# Patient Record
Sex: Male | Born: 1975 | Race: Black or African American | Hispanic: No | Marital: Married | Smoking: Never smoker
Health system: Southern US, Community
[De-identification: ages and names within clinical notes are randomized; demographics above are authoritative.]

## PROBLEM LIST (undated history)

## (undated) DIAGNOSIS — I10 Essential (primary) hypertension: Secondary | ICD-10-CM

## (undated) HISTORY — PX: OTHER SURGICAL HISTORY: SHX169

## (undated) HISTORY — PX: MANDIBLE FRACTURE SURGERY: SHX706

---

## 2004-10-25 ENCOUNTER — Emergency Department (HOSPITAL_COMMUNITY): Admission: EM | Admit: 2004-10-25 | Discharge: 2004-10-25 | Payer: Self-pay | Admitting: Emergency Medicine

## 2005-07-23 ENCOUNTER — Emergency Department (HOSPITAL_COMMUNITY): Admission: EM | Admit: 2005-07-23 | Discharge: 2005-07-23 | Payer: Self-pay | Admitting: Emergency Medicine

## 2005-07-24 ENCOUNTER — Emergency Department (HOSPITAL_COMMUNITY): Admission: EM | Admit: 2005-07-24 | Discharge: 2005-07-24 | Payer: Self-pay | Admitting: Emergency Medicine

## 2005-10-20 ENCOUNTER — Emergency Department (HOSPITAL_COMMUNITY): Admission: EM | Admit: 2005-10-20 | Discharge: 2005-10-20 | Payer: Self-pay | Admitting: Emergency Medicine

## 2007-03-19 ENCOUNTER — Emergency Department (HOSPITAL_COMMUNITY): Admission: EM | Admit: 2007-03-19 | Discharge: 2007-03-19 | Payer: Self-pay | Admitting: Emergency Medicine

## 2007-06-18 ENCOUNTER — Emergency Department (HOSPITAL_COMMUNITY): Admission: EM | Admit: 2007-06-18 | Discharge: 2007-06-18 | Payer: Self-pay | Admitting: Emergency Medicine

## 2008-05-02 ENCOUNTER — Emergency Department (HOSPITAL_COMMUNITY): Admission: EM | Admit: 2008-05-02 | Discharge: 2008-05-02 | Payer: Self-pay | Admitting: Emergency Medicine

## 2008-05-03 ENCOUNTER — Ambulatory Visit (HOSPITAL_COMMUNITY): Admission: RE | Admit: 2008-05-03 | Discharge: 2008-05-03 | Payer: Self-pay | Admitting: Orthopedic Surgery

## 2010-07-23 LAB — CBC
Hemoglobin: 14.8 g/dL (ref 13.0–17.0)
Platelets: 253 10*3/uL (ref 150–400)
RBC: 5.03 MIL/uL (ref 4.22–5.81)

## 2010-08-21 NOTE — Op Note (Signed)
NAMEAADIL, SUR             ACCOUNT NO.:  [id number]   MEDICAL RECORD NO.:  [id number]          PATIENT TYPE:  AMB   LOCATION:  SDS                          FACILITY:  [facility]   PHYSICIAN:  Burnard Bunting, M.D.    DATE OF BIRTH:  06-Nov-1975   DATE OF PROCEDURE:  05/03/2008  DATE OF DISCHARGE:  05/03/2008                               OPERATIVE REPORT   PREOPERATIVE DIAGNOSIS:  Right Achilles tendon rupture repair.   POSTOPERATIVE DIAGNOSIS:  Right Achilles tendon rupture repair.   PROCEDURE:  Right Achilles tendon rupture repair.   SURGEON:  Burnard Bunting, MD   ASSISTANT:  None.   ANESTHESIA:  General endotracheal.   ESTIMATED BLOOD LOSS:  Minimal.   INDICATIONS:  Richard Valentine is an active 35 year old patient with  right Achilles tendon rupture who presents now for repair after  explanation of risks and benefits.   PROCEDURE IN DETAIL:  The patient was brought to the operating room  where general endotracheal anesthesia was induced.  Preoperative  antibiotics were  administered.  Time-out was called.  The patient was  placed in a prone position.  Both lower extremities were then prepped  with DuraPrep solution and draped in a sterile manner.  Right lower  extremity over the area of the incision was pre-scrubbed with a  chlorhexidine, alcohol and Betadine which allowed the air dry and then  prepped with DuraPrep.  Collier Flowers was used to cover the operative field.  Ankle Esmarch was utilized for approximately 50 minutes.  Midline  incision was made.  Skin and subcutaneous tissue were sharply divided.  Paratenon was divided as well.  Achilles tendon was identified.  The  patient had a midsubstance rupture with about 2 cm gap.  Two #2  FiberWire sutures were placed in grasping [place] fashion through each to  the tendon ends.  Both legs were then flexed and the four strands were  tied on the right leg to the resting tension of the Achilles tendon on  the left leg.  The tendon  was then oversewn with 3-0 Vicryl suture at  the edges.  Incision was thoroughly irrigated.  Paratenon was then  closed over the tendon using 3-0 Vicryl suture.  Tourniquet was  released.  Bleeding points encountered and controlled with  electrocautery.  The skin was closed using interrupted inverted 3-0  Vicryl suture, followed by 3-0 nylon simple sutures.  Bulky, well-padded  posterior splint was applied.  The patient tolerated the procedure well  without any immediate complications.      Burnard Bunting, M.D.  Electronically Signed    GSD/MEDQ  D:  05/03/2008  T:  05/04/2008  Job:  [id number]

## 2010-09-13 ENCOUNTER — Emergency Department (HOSPITAL_COMMUNITY)
Admission: EM | Admit: 2010-09-13 | Discharge: 2010-09-13 | Disposition: A | Discharge status: Home / Self Care | Payer: Self-pay | Attending: Emergency Medicine | Admitting: Emergency Medicine

## 2010-09-13 DIAGNOSIS — IMO0001 Reserved for inherently not codable concepts without codable children: Secondary | ICD-10-CM | POA: Insufficient documentation

## 2010-09-13 DIAGNOSIS — B9789 Other viral agents as the cause of diseases classified elsewhere: Secondary | ICD-10-CM | POA: Insufficient documentation

## 2010-09-13 DIAGNOSIS — R5381 Other malaise: Secondary | ICD-10-CM | POA: Insufficient documentation

## 2010-09-13 DIAGNOSIS — R070 Pain in throat: Secondary | ICD-10-CM | POA: Insufficient documentation

## 2010-09-13 DIAGNOSIS — R509 Fever, unspecified: Secondary | ICD-10-CM | POA: Insufficient documentation

## 2010-09-13 DIAGNOSIS — J3489 Other specified disorders of nose and nasal sinuses: Secondary | ICD-10-CM | POA: Insufficient documentation

## 2010-09-13 LAB — URINALYSIS, ROUTINE W REFLEX MICROSCOPIC
Glucose, UA: NEGATIVE mg/dL
Ketones, ur: NEGATIVE mg/dL
Nitrite: NEGATIVE
Specific Gravity, Urine: 1.007 (ref 1.005–1.030)

## 2010-09-13 LAB — URINE MICROSCOPIC-ADD ON

## 2010-12-31 LAB — POCT URINALYSIS DIP (DEVICE)
Glucose, UA: NEGATIVE
Ketones, ur: NEGATIVE
Operator id: 247071
Urobilinogen, UA: 1
pH: 6.5

## 2011-05-29 ENCOUNTER — Encounter (HOSPITAL_COMMUNITY): Payer: Self-pay | Admitting: Emergency Medicine

## 2011-05-29 ENCOUNTER — Other Ambulatory Visit: Payer: Self-pay

## 2011-05-29 ENCOUNTER — Emergency Department (HOSPITAL_COMMUNITY): Payer: Self-pay

## 2011-05-29 ENCOUNTER — Emergency Department (HOSPITAL_COMMUNITY)
Admission: EM | Admit: 2011-05-29 | Discharge: 2011-05-29 | Disposition: A | Discharge status: Home Health | Payer: Self-pay | Attending: Emergency Medicine | Admitting: Emergency Medicine

## 2011-05-29 DIAGNOSIS — I10 Essential (primary) hypertension: Secondary | ICD-10-CM | POA: Insufficient documentation

## 2011-05-29 DIAGNOSIS — R0789 Other chest pain: Secondary | ICD-10-CM

## 2011-05-29 DIAGNOSIS — R1013 Epigastric pain: Secondary | ICD-10-CM | POA: Insufficient documentation

## 2011-05-29 DIAGNOSIS — R079 Chest pain, unspecified: Secondary | ICD-10-CM | POA: Insufficient documentation

## 2011-05-29 LAB — CBC
HCT: 41.4 % (ref 39.0–52.0)
MCH: 29.2 pg (ref 26.0–34.0)
Platelets: 224 10*3/uL (ref 150–400)

## 2011-05-29 LAB — TROPONIN I: Troponin I: <0.3 ng/mL (ref <0.30)

## 2011-05-29 LAB — DIFFERENTIAL
Basophils Absolute: 0 10*3/uL (ref 0.0–0.1)
Basophils Relative: 0 % (ref 0–1)
Eosinophils Absolute: 0.3 10*3/uL (ref 0.0–0.7)
Lymphocytes Relative: 44 % (ref 12–46)
Monocytes Relative: 8 % (ref 3–12)
Neutro Abs: 2.4 10*3/uL (ref 1.7–7.7)

## 2011-05-29 LAB — COMPREHENSIVE METABOLIC PANEL
AST: 20 U/L (ref 0–37)
Chloride: 103 mEq/L (ref 96–112)
Potassium: 4.1 mEq/L (ref 3.5–5.1)

## 2011-05-29 LAB — D-DIMER, QUANTITATIVE: D-Dimer, Quant: <0.22 ug/mL-FEU (ref 0.00–0.48)

## 2011-05-29 LAB — LIPASE, BLOOD: Lipase: 25 U/L (ref 11–59)

## 2011-05-29 MED ORDER — GI COCKTAIL ~~LOC~~
30.0000 mL | Freq: Once | ORAL | Status: AC
  Administered 2011-05-29: 30 mL via ORAL
  Filled 2011-05-29: qty 30

## 2011-05-29 MED ORDER — FAMOTIDINE 20 MG PO TABS
40.0000 mg | ORAL_TABLET | Freq: Once | ORAL | Status: AC
  Administered 2011-05-29: 40 mg via ORAL
  Filled 2011-05-29: qty 2

## 2011-05-29 NOTE — Discharge Instructions (Signed)
RESOURCE GUIDE  Dental Problems  Patients with Medicaid: [facility]                     [facility] [address]                                           [address] Phone:  [phone]                                                  Phone:  [phone]  If unable to pay or uninsured, contact:  [facility] or [facility] to become qualified for the adult dental clinic.  Chronic Pain Problems Contact [facility]  [phone] Patients need to be referred by their primary care doctor.  Insufficient Money for Medicine Contact [employer]:  call "211" or [facility] 271-5999.  No Primary Care Doctor Call [facility]  832-8000 Other agencies that provide inexpensive medical care    [facility]  [phone]    [facility]  [phone]    [facility]  [phone]    Women[facility] Clinic  [phone]    [facility]  [phone]    [facility]  [phone]  Psychological Services [facility]  [phone] [facility]  [phone] [facility]   [phone] (emergency services [phone])  Substance Abuse Resources [facility]  336-882-2125 [facility] 336-784-9470 [facility] 336-285-9073 [facility] 336-845-3988 Residential & Outpatient Substance Abuse Program  800-659-3381  Abuse/Neglect [place] Child Abuse Hotline (336) 641-3795 [place] Child Abuse Hotline 800-378-5315 (After Hours)  Emergency Shelter [facility] (336) 271-5985  Maternity [facility] (336) 275-9566 [facility] (704) 372-4663  MRSA Hotline #:   832-7006    [place] [facility]  [facility] [place]     [employer]                          [facility] [address] [place]                       [address]      [address]  [place]                                                [place]                            [place] Phone:  [phone]                                   Phone:  [phone]                 Phone:  [phone]  [facility] Phone:  [phone]    [facility] Child Abuse Hotline 5394910695 4321898286 (After Hours)    Eat a bland diet, avoiding greasy, fatty, fried foods, as well as spicy and acidic foods or beverages.  Avoid eating within the hour or 2 before going to bed or laying down.  Also avoid teas, colas, coffee, chocolate, pepermint and spearment.  Take over the counter pepcid, one tablet by mouth twice a day, for the next 2 to 3 weeks.  May also take over the counter maalox/mylanta, as directed on packaging, as needed for discomfort.  Keep a daily diary of your blood pressures, as discussed, to show your regular medical doctor during your follow up visit to determine if you need to be started on blood pressure medication.  Call your regular medical doctor today to schedule a follow up appointment this week.  Return to the Emergency Department immediately if worsening.

## 2011-05-29 NOTE — ED Notes (Signed)
Noted onset of fatigue and shortness of breath one week ago, came today d/t felt he should get it checked out.  Slight mid-sternal chest pain demonstrated w/ shortness of breath, diaphoresis, bilateral arm tingling

## 2011-05-29 NOTE — ED Provider Notes (Signed)
History     CSN: [id number]  Arrival date & time 05/29/11  4098   First MD Initiated Contact with Patient 05/29/11 0915      Chief Complaint  Patient presents with  . Chest Pain     HPI Pt was seen at 0930.  Per pt, c/o gradual onset and persistence of constant lower mid-sternal/upper abd "pain" x1 week.  Pt states he has had intermittent symptoms "for quite a while now," worsened over the past week.  Symptoms worsen after eating, are assoc with SOB.  Describes the SOB as "the pain takes my breath away."  Describes the pain as "like a bubble."  Denies palpitations, no cough, no N/V/D, no back pain, no fevers, no rash, no calf/LE pain or unilateral swelling.      PMD:  Dr. Ronne Binning No past medical history on file.  Past Surgical History  Procedure Date  . Patella tenden rupture repair   . Achilles tenden rupture repair     History  Substance Use Topics  . Smoking status: Never Smoker   . Smokeless tobacco: Never Used  . Alcohol Use: 5.2 oz/week    7 Shots of liquor, 2 Drinks containing 0.5 oz of alcohol per week    Review of Systems ROS: Statement: All systems negative except as marked or noted in the HPI; Constitutional: Negative for fever and chills. ; ; Eyes: Negative for eye pain, redness and discharge. ; ; ENMT: Negative for ear pain, hoarseness, nasal congestion, sinus pressure and sore throat. ; ; Cardiovascular: +CP, SOB. Negative for palpitations and peripheral edema. ; ; Respiratory: Negative for cough, wheezing and stridor. ; ; Gastrointestinal: +upper abd pain. Negative for nausea, vomiting, diarrhea, blood in stool, hematemesis, jaundice and rectal bleeding. . ; ; Genitourinary: Negative for dysuria, flank pain and hematuria. ; ; Musculoskeletal: Negative for back pain and neck pain. Negative for swelling and trauma.; ; Skin: Negative for pruritus, rash, abrasions, blisters, bruising and skin lesion.; ; Neuro: Negative for headache, lightheadedness and neck stiffness.  Negative for weakness, altered level of consciousness , altered mental status, extremity weakness, paresthesias, involuntary movement, seizure and syncope.     Allergies  Percocet  Home Medications  No current outpatient prescriptions on file.  BP 182/105  Pulse 73  Temp(Src) 98.6 F (37 C) (Oral)  Resp 16  Ht 5\' 11"  (1.803 m)  Wt 210 lb (95.255 kg)  BMI 29.29 kg/m2  SpO2 100%  Physical Exam 0935: Physical examination:  Nursing notes reviewed; Vital signs and O2 SAT reviewed;  Constitutional: Well developed, Well nourished, Well hydrated, In no acute distress; Head:  Normocephalic, atraumatic; Eyes: EOMI, PERRL, No scleral icterus; ENMT: Mouth and pharynx normal, Mucous membranes moist; Neck: Supple, Full range of motion, No lymphadenopathy; Cardiovascular: Regular rate and rhythm, No murmur, rub, or gallop; Respiratory: Breath sounds clear & equal bilaterally, No rales, rhonchi, wheezes, or rub, Normal respiratory effort/excursion; Chest: Nontender, Movement normal; Abdomen: Soft, +mild mid-epigastric tenderness to palp, no rebound or guarding, Nondistended, Normal bowel sounds; Extremities: Pulses normal, No tenderness, No edema, No calf edema or asymmetry.; Neuro: AA&Ox3, Major CN grossly intact. Speech clear, no facial droop, normal coordination.  No gross focal motor or sensory deficits in extremities.; Skin: Color normal, Warm, Dry, no rash.    ED Course  Procedures   MDM  MDM Reviewed: nursing note and vitals Interpretation: ECG, labs and x-ray    Date: 05/29/2011  Rate: 88  Rhythm: normal sinus rhythm  QRS Axis: normal  Intervals: normal  ST/T Wave abnormalities: normal  Conduction Disutrbances:none  Narrative Interpretation:   Old EKG Reviewed: none available.   Results for orders placed during the hospital encounter of 05/29/11  CBC      Component Value Range   WBC 5.6  4.0 - 10.5 (K/uL)   RBC 5.03  4.22 - 5.81 (MIL/uL)   Hemoglobin 14.7  13.0 - 17.0 (g/dL)     HCT 16.1  09.6 - 04.5 (%)   MCV 82.3  78.0 - 100.0 (fL)   MCH 29.2  26.0 - 34.0 (pg)   MCHC 35.5  30.0 - 36.0 (g/dL)   RDW 40.9  81.1 - 91.4 (%)   Platelets 224  150 - 400 (K/uL)  DIFFERENTIAL      Component Value Range   Neutrophils Relative 43  43 - 77 (%)   Neutro Abs 2.4  1.7 - 7.7 (K/uL)   Lymphocytes Relative 44  12 - 46 (%)   Lymphs Abs 2.5  0.7 - 4.0 (K/uL)   Monocytes Relative 8  3 - 12 (%)   Monocytes Absolute 0.4  0.1 - 1.0 (K/uL)   Eosinophils Relative 5  0 - 5 (%)   Eosinophils Absolute 0.3  0.0 - 0.7 (K/uL)   Basophils Relative 0  0 - 1 (%)   Basophils Absolute 0.0  0.0 - 0.1 (K/uL)  COMPREHENSIVE METABOLIC PANEL      Component Value Range   Sodium 138  135 - 145 (mEq/L)   Potassium 4.1  3.5 - 5.1 (mEq/L)   Chloride 103  96 - 112 (mEq/L)   CO2 27  19 - 32 (mEq/L)   Glucose, Bld 99  70 - 99 (mg/dL)   BUN 13  6 - 23 (mg/dL)   Creatinine, Ser 7.82  0.50 - 1.35 (mg/dL)   Calcium 9.6  8.4 - 95.6 (mg/dL)   Total Protein 7.7  6.0 - 8.3 (g/dL)   Albumin 4.0  3.5 - 5.2 (g/dL)   AST 20  0 - 37 (U/L)   ALT 21  0 - 53 (U/L)   Alkaline Phosphatase 46  39 - 117 (U/L)   Total Bilirubin 0.3  0.3 - 1.2 (mg/dL)   GFR calc non Af Amer 70 (*) >90 (mL/min)   GFR calc Af Amer 82 (*) >90 (mL/min)  LIPASE, BLOOD      Component Value Range   Lipase 25  11 - 59 (U/L)  TROPONIN I      Component Value Range   Troponin I <0.30  <0.30 (ng/mL)  D-DIMER, QUANTITATIVE      Component Value Range   D-Dimer, Quant <0.22  0.00 - 0.48 (ug/mL-FEU)   Dg Chest 2 View 05/29/2011  *RADIOLOGY REPORT*  Clinical Data: 36 year old male with chest pain, shortness of breath.  CHEST - 2 VIEW  Comparison: None.  Findings: Lung volumes are within normal limits. Normal cardiac size and mediastinal contours.  Visualized tracheal air column is within normal limits.  The lungs are clear.  No pneumothorax or effusion. No osseous abnormality identified.  IMPRESSION: Negative, no acute cardiopulmonary  abnormality.  Original Report Authenticated By: Harley Hallmark, M.D.     11:52 AM:  Feels improved after meds and wants to go home now.  BP now 150/99, HR 80's, Sats 100% R/A on my re-exam.  Doubt PE with neg d-dimer, no hypoxia/tachycardia/tachypnea today.  Doubt ACS as cause for symptoms given constant symptoms x1 week with neg troponin and normal EKG.  Long  d/w pt regarding HTN, including keeping BP diary to show his PMD at f/u visit.  States he didn't go see his PMD for these ongoing symptoms ("for a while now") because his doctor "only has certain hours he's open to see people."  Is agreeable to make an appt with his PMD for f/u.  Dx testing d/w pt.  Questions answered.  Verb understanding, agreeable to d/c home with outpt f/u.            Laray Anger, DO 05/30/11 [phone]

## 2014-03-08 ENCOUNTER — Encounter (HOSPITAL_COMMUNITY): Payer: Self-pay | Admitting: *Deleted

## 2014-03-08 ENCOUNTER — Emergency Department (HOSPITAL_COMMUNITY)
Admission: EM | Admit: 2014-03-08 | Discharge: 2014-03-08 | Disposition: A | Discharge status: Nursing Home (Includes ICF) | Payer: No Typology Code available for payment source | Source: Home / Self Care | Attending: Emergency Medicine | Admitting: Emergency Medicine

## 2014-03-08 ENCOUNTER — Emergency Department (HOSPITAL_COMMUNITY): Payer: No Typology Code available for payment source

## 2014-03-08 ENCOUNTER — Encounter (HOSPITAL_COMMUNITY): Payer: Self-pay

## 2014-03-08 ENCOUNTER — Emergency Department (HOSPITAL_COMMUNITY)
Admission: EM | Admit: 2014-03-08 | Discharge: 2014-03-09 | Disposition: A | Discharge status: Home / Self Care | Payer: No Typology Code available for payment source | Attending: Emergency Medicine | Admitting: Emergency Medicine

## 2014-03-08 DIAGNOSIS — R5383 Other fatigue: Secondary | ICD-10-CM | POA: Diagnosis not present

## 2014-03-08 DIAGNOSIS — I209 Angina pectoris, unspecified: Secondary | ICD-10-CM

## 2014-03-08 DIAGNOSIS — R0789 Other chest pain: Secondary | ICD-10-CM | POA: Insufficient documentation

## 2014-03-08 DIAGNOSIS — I10 Essential (primary) hypertension: Secondary | ICD-10-CM | POA: Diagnosis not present

## 2014-03-08 DIAGNOSIS — R079 Chest pain, unspecified: Secondary | ICD-10-CM

## 2014-03-08 LAB — I-STAT TROPONIN, ED: Troponin i, poc: 0 ng/mL (ref 0.00–0.08)

## 2014-03-08 LAB — BASIC METABOLIC PANEL
Anion gap: 13 (ref 5–15)
BUN: 14 mg/dL (ref 6–23)
CHLORIDE: 99 meq/L (ref 96–112)
CO2: 25 meq/L (ref 19–32)
CREATININE: 1.35 mg/dL (ref 0.50–1.35)
Calcium: 9 mg/dL (ref 8.4–10.5)
GFR calc Af Amer: 77 mL/min — ABNORMAL LOW (ref >90)
GFR calc non Af Amer: 66 mL/min — ABNORMAL LOW (ref >90)
GLUCOSE: 102 mg/dL — AB (ref 70–99)
POTASSIUM: 3.7 meq/L (ref 3.7–5.3)
Sodium: 137 mEq/L (ref 137–147)

## 2014-03-08 LAB — CBC
HEMATOCRIT: 40.3 % (ref 39.0–52.0)
HEMOGLOBIN: 14.2 g/dL (ref 13.0–17.0)
MCH: 29.9 pg (ref 26.0–34.0)
MCHC: 35.2 g/dL (ref 30.0–36.0)
MCV: 84.8 fL (ref 78.0–100.0)
Platelets: 242 10*3/uL (ref 150–400)
RBC: 4.75 MIL/uL (ref 4.22–5.81)
RDW: 12.3 % (ref 11.5–15.5)
WBC: 7.7 10*3/uL (ref 4.0–10.5)

## 2014-03-08 MED ORDER — ASPIRIN 81 MG PO CHEW
CHEWABLE_TABLET | ORAL | Status: AC
  Filled 2014-03-08: qty 4

## 2014-03-08 MED ORDER — HYDRALAZINE HCL 10 MG PO TABS
10.0000 mg | ORAL_TABLET | Freq: Once | ORAL | Status: AC
  Administered 2014-03-08: 10 mg via ORAL
  Filled 2014-03-08: qty 1

## 2014-03-08 MED ORDER — ASPIRIN 81 MG PO CHEW
324.0000 mg | CHEWABLE_TABLET | Freq: Once | ORAL | Status: AC
  Administered 2014-03-08: 324 mg via ORAL

## 2014-03-08 MED ORDER — ASPIRIN 81 MG PO CHEW: 324.0000 mg | CHEWABLE_TABLET | Freq: Once | ORAL | Status: DC

## 2014-03-08 MED ORDER — NITROGLYCERIN 0.4 MG SL SUBL
0.4000 mg | SUBLINGUAL_TABLET | SUBLINGUAL | Status: AC | PRN
  Administered 2014-03-08: 0.4 mg via SUBLINGUAL

## 2014-03-08 MED ORDER — NITROGLYCERIN 0.4 MG SL SUBL
SUBLINGUAL_TABLET | SUBLINGUAL | Status: AC
  Filled 2014-03-08: qty 1

## 2014-03-08 NOTE — ED Notes (Signed)
[facility] 1 week mid chest dull 3/10; Pt denies hx smoking and htn; pt presents with htn; pt states some Sob throughout week when climbing stairs but denies Sob currently; pt a&o x 4; pt ambulates independently.

## 2014-03-08 NOTE — ED Notes (Signed)
C/o mid to R chest pain onset 1 week.  Pain comes and goes.  Pain lasts for 1 hr or 2 when he has it.  Has had it twice today.  No pain now.  C/o SOB on exertion of going up stairs and occasionally when he has the pain.  No nausea or sweating.  Pain radiates to R post. shoulder sometimes.  Has a little dull pain when he takes a deep breath. He lifts weights- last time was yesterday but did not have pain then.  States the pain is inside and knows the difference between muscle pain and something else.  States BP 3 days 160/96 and that is high for him.  Denies drug use.

## 2014-03-08 NOTE — Discharge Instructions (Signed)
We have determined that your problem requires further evaluation in the emergency department.  We will take care of your transport there.  Once at the emergency department, you will be evaluated by a provider and they will order whatever treatment or tests they deem necessary.  We cannot guarantee that they will do any specific test or do any specific treatment.  ° °

## 2014-03-08 NOTE — ED Provider Notes (Signed)
Chief Complaint   Chest Pain   History of Present Illness    Richard Valentine is a 38 year old male with a positive family history of coronary artery disease whose had a one half week history of recurring episodes of right parasternal chest pain radiating through to his back. The pain is rated 5/10 in intensity. It can last for minutes to hours at a time. It's not brought on by exertion. The pain is nonpleuritic. It is associated with shortness of breath, fatigue, palpitations, and dizziness. He denies any nausea or diaphoresis. He has no known heart disease himself, but his father is 6572 and has coronary artery disease and stents. He gets does not have any history of high blood pressure, but his blood pressure was 194/114 today. He has no diabetes or hypercholesterolemia history. He is not a cigarette smoker.  Review of Systems    Other than noted above, the patient denies any of the following symptoms. Systemic:  No fever or chills. Pulmonary:  No cough, wheezing, shortness of breath, sputum production, hemoptysis. Cardiac:  No palpitations, rapid heartbeat, dizziness, presyncope or syncope. GI:  No abdominal pain, heartburn, nausea, or vomiting. Ext:  No leg pain or swelling.  PMFSH    Past medical history, family history, social history, meds, and allergies were reviewed. He's allergic to Percocet.  Physical Exam     Vital signs:  BP 194/114 mmHg  Pulse 82  Temp(Src) 99 F (37.2 C) (Oral)  Resp 16  SpO2 100% Gen:  Alert, oriented, in no distress, skin warm and dry. ENT:  Mucous membranes moist, pharynx clear. Neck:  Supple, no adenopathy or tenderness.  No JVD. Lungs:  Clear to auscultation, no wheezes, rales or rhonchi.  No respiratory distress. Heart:  Regular rhythm.  No gallops, murmers, clicks or rubs. Chest:  No chest wall tenderness. Abdomen:  Soft, nontender, no organomegaly or mass.  Bowel sounds normal.  No pulsatile abdominal mass or bruit. Ext:  No edema.  No  calf tenderness and Homann's sign negative.  Pulses full and equal. Skin:  Warm and dry.  No rash.   EKG Results:  Date: 03/08/2014  Rate: 93  Rhythm: normal sinus rhythm  QRS Axis: normal--27  Intervals: normal--QTc interval 469 ms  ST/T Wave abnormalities: normal  Conduction Disutrbances:none  Narrative Interpretation: Normal sinus rhythm, normal EKG.  Old EKG Reviewed: none available    Course in Urgent Care Center   The following medications were given:  Medications  aspirin chewable tablet 324 mg   nitroGLYCERIN (NITROSTAT) SL tablet 0.4 mg                                                                                                                                       Assessment     The encounter diagnosis was Angina pectoris.  Plan     The patient was transferred to the ED via EMS in  stable condition.  Medical Decision Making:  38 year old male with a p46os FH for CAD has a 1 and 1/2 week history of recurrent right parasternal chest pain radiating to back associated with shortness of breath.  No nausea or diaphoresis.  No pleuritic pain.  No exertional chest pain.  EKG was WNL.  Will start IV, O2, monitor, TNG and ASA.  Transfer by ambulance.        Reuben Likesavid C Jakolby Sedivy, MD 03/08/14 [phone]

## 2014-03-08 NOTE — ED Notes (Signed)
Notified [facility] 

## 2014-03-08 NOTE — ED Notes (Signed)
Report given to [facility]. 

## 2014-03-08 NOTE — ED Notes (Signed)
Phlebotomy at bedside.

## 2014-03-08 NOTE — ED Provider Notes (Signed)
Date: 03/08/2014 2018  Rate: 80  Rhythm: normal sinus rhythm  QRS Axis: normal  Intervals: normal  ST/T Wave abnormalities: nonspecific T wave changes  Conduction Disutrbances:none  Narrative Interpretation:   Old EKG Reviewed: unchanged    Joya Gaskinsonald W Hilmer Aliberti, MD 03/08/14 2038

## 2014-03-08 NOTE — ED Provider Notes (Signed)
CSN: [id number]     Arrival date & time 03/08/14  2010 History   First MD Initiated Contact with Patient 03/08/14 2013     Chief Complaint  Patient presents with  . Chest Pain  . Hypertension     (Consider location/radiation/quality/duration/timing/severity/associated sxs/prior Treatment) Patient is a 38 y.o. male presenting with chest pain.  Chest Pain Pain location:  Substernal area Pain quality: aching, dull and radiating   Pain quality: no pressure and no tightness   Pain radiates to:  Upper back Pain radiates to the back: yes   Pain severity:  Mild Onset quality: INTERMITTENT. Timing:  Intermittent Progression:  Unchanged Context: movement   Context: not breathing   Relieved by:  None tried Worsened by:  Nothing tried Ineffective treatments:  None tried Associated symptoms: fatigue   Associated symptoms: no abdominal pain, no altered mental status, no anorexia, no anxiety, no back pain, no claudication, no cough, no diaphoresis, no dizziness, no dysphagia, no fever, no headache, no heartburn, no lower extremity edema, no near-syncope, no numbness, no orthopnea, no palpitations, no PND, no shortness of breath, no syncope, not vomiting and no weakness     History reviewed. No pertinent past medical history. Past Surgical History  Procedure Laterality Date  . Patella tenden rupture repair    . Achilles tenden rupture repair     Family History  Problem Relation Age of Onset  . Hypertension Father    History  Substance Use Topics  . Smoking status: Never Smoker   . Smokeless tobacco: Never Used  . Alcohol Use: 13.2 oz/week    20 Shots of liquor, 2 Not specified per week     Comment: pt states drinking amount varies from week to week; mostly drinks on the weekends     Review of Systems  Constitutional: Positive for fatigue. Negative for fever and diaphoresis.  HENT: Negative for trouble swallowing.   Respiratory: Negative for cough and shortness of breath.    Cardiovascular: Positive for chest pain. Negative for palpitations, orthopnea, claudication, syncope, PND and near-syncope.  Gastrointestinal: Negative for heartburn, vomiting, abdominal pain and anorexia.  Musculoskeletal: Negative for back pain.  Neurological: Negative for dizziness, weakness, numbness and headaches.  All other systems reviewed and are negative.     Allergies  Percocet  Home Medications   Prior to Admission medications   Medication Sig Start Date End Date Taking? Authorizing Provider  hydrochlorothiazide (HYDRODIURIL) 25 MG tablet Take 1 tablet (25 mg total) by mouth daily. 03/09/14   Soren Pigman, PA-C   BP 185/114 mmHg  Pulse 79  Temp(Src) 98.3 F (36.8 C) (Oral)  Resp 10  Ht 5\' 11"  (1.803 m)  Wt 215 lb (97.523 kg)  BMI 30.00 kg/m2  SpO2 100% Physical Exam  Constitutional: He appears well-developed and well-nourished. No distress.  HENT:  Head: Normocephalic and atraumatic.  Eyes: Conjunctivae are normal. No scleral icterus.  Neck: Normal range of motion. Neck supple.  Cardiovascular: Normal rate, regular rhythm and normal heart sounds.   Pulmonary/Chest: Effort normal and breath sounds normal. No respiratory distress.  Abdominal: Soft. There is no tenderness.  Musculoskeletal: He exhibits no edema.  Neurological: He is alert.  Skin: Skin is warm and dry. He is not diaphoretic.  Psychiatric: His behavior is normal.  Nursing note and vitals reviewed.   ED Course  Procedures (including critical care time) Labs Review Labs Reviewed  BASIC METABOLIC PANEL - Abnormal; Notable for the following:    Glucose, Bld 102 (*)  GFR calc non Af Amer 66 (*)    GFR calc Af Amer 77 (*)    All other components within normal limits  CBC  I-STAT TROPOININ, ED  Rosezena SensorI-STAT TROPOININ, ED    Imaging Review Dg Chest 2 View  03/08/2014   CLINICAL DATA:  Chest pain.  EXAM: CHEST  2 VIEW  COMPARISON:  PA and lateral chest 05/29/2011.  FINDINGS: Heart size and  mediastinal contours are within normal limits. Both lungs are clear. Visualized skeletal structures are unremarkable.  IMPRESSION: Negative exam.   Electronically Signed   By: Drusilla Kannerhomas  Dalessio M.D.   On: 03/08/2014 21:34     EKG Interpretation   Date/Time:  Tuesday March 08 2014 20:18:33 EST Ventricular Rate:  80 PR Interval:  177 QRS Duration: 98 QT Interval:  376 QTC Calculation: 434 R Axis:   7 Text Interpretation:  Sinus rhythm Borderline T abnormalities, inferior  leads ED PHYSICIAN INTERPRETATION AVAILABLE IN [facility] Confirmed  by TEST, Record ([id number]) on 03/10/2014 7:40:42 AM      MDM   Final diagnoses:  Chest pain, unspecified chest pain type  Essential hypertension     PATIENT WITH C/O CP WHICH HAS been intermittent for the past 3 weeks. Patient has had previous workups for this and diagnosed with chest wall pain. He is however hypertensive. No concern for aortic dissection. Patient rates his pain as minimal. He states that he predominantly came in because he has been feeling fatigued lately. Initial troponin, CBC negative, EKG unchanged from previous and without signs of acute ischemia. Glucose slightly elevated.   The patient remained hypertensive throughout visit. He will need outpatient follow-up and has been given referral [facility]  [facility]. Will discharge with 25 mg hydrochlorothiazide daily  Patient is to be discharged with recommendation to follow up with PCP in regards to today's hospital visit. Chest pain is not likely of cardiac or pulmonary etiology d/t presentation, perc negative, VSS, no tracheal deviation, no JVD or new murmur, RRR, breath sounds equal bilaterally, EKG without acute abnormalities, negative troponin, and negative CXR. Pt has been advised start a PPI and return to the ED is CP becomes exertional, associated with diaphoresis or nausea, radiates to left jaw/arm, worsens or becomes concerning in any way. Pt appears  reliable for follow up and is agreeable to discharge.   Case has been discussed with and seen by Dr. Bebe ShaggyWickline who agrees with the above plan to discharge.    Arthor Captainbigail Ardys Hataway, PA-C 03/10/14 [phone]  Joya Gaskinsonald W Wickline, MD 03/11/14 [phone]

## 2014-03-09 LAB — I-STAT TROPONIN, ED: Troponin i, poc: 0 ng/mL (ref 0.00–0.08)

## 2014-03-09 MED ORDER — HYDROCHLOROTHIAZIDE 25 MG PO TABS: 25.0000 mg | ORAL_TABLET | Freq: Every day | ORAL | Status: DC

## 2014-03-09 NOTE — Discharge Instructions (Signed)
You had 2 negative troponins ( a protein which elevates during heart attacks) , and your risk for having a major adverse cardiac event is low.  Your blood pressure is to high and needs to be managed  By a primary care doctor! Long term high blood pressure can cause kidney failure, blindness, heart attack, stroke and premature erectile dysfunction. Please take care of this immediately. Your caregiver has diagnosed you as having chest pain that is not specific for one problem, but does not require admission.  You are at low risk for an acute heart condition or other serious illness. Chest pain comes from many different causes.  SEEK IMMEDIATE MEDICAL ATTENTION IF: You have severe chest pain, especially if the pain is crushing or pressure-like and spreads to the arms, back, neck, or jaw, or if you have sweating, nausea (feeling sick to your stomach), or shortness of breath. THIS IS AN EMERGENCY. Don't wait to see if the pain will go away. Get medical help at once. Call 911 or [phone] (operator). DO NOT drive yourself to the hospital.  Your chest pain gets worse and does not go away with rest.  You have an attack of chest pain lasting longer than usual, despite rest and treatment with the medications your caregiver has prescribed.  You wake from sleep with chest pain or shortness of breath.  You feel dizzy or faint.  You have chest pain not typical of your usual pain for which you originally saw your caregiver.  Hypertension Hypertension, commonly called high blood pressure, is when the force of blood pumping through your arteries is too strong. Your arteries are the blood vessels that carry blood from your heart throughout your body. A blood pressure reading consists of a higher number over a lower number, such as 110/72. The higher number (systolic) is the pressure inside your arteries when your heart pumps. The lower number (diastolic) is the pressure inside your arteries when your heart relaxes. Ideally you  want your blood pressure below 120/80. Hypertension forces your heart to work harder to pump blood. Your arteries may become narrow or stiff. Having hypertension puts you at risk for heart disease, stroke, and other problems.  RISK FACTORS Some risk factors for high blood pressure are controllable. Others are not.  Risk factors you cannot control include:   Race. You may be at higher risk if you are African American.  Age. Risk increases with age.  Gender. Men are at higher risk than women before age 55 years. After age 76, women are at higher risk than men. Risk factors you can control include:  Not getting enough exercise or physical activity.  Being overweight.  Getting too much fat, sugar, calories, or salt in your diet.  Drinking too much alcohol. SIGNS AND SYMPTOMS Hypertension does not usually cause signs or symptoms. Extremely high blood pressure (hypertensive crisis) may cause headache, anxiety, shortness of breath, and nosebleed. DIAGNOSIS  To check if you have hypertension, your health care provider will measure your blood pressure while you are seated, with your arm held at the level of your heart. It should be measured at least twice using the same arm. Certain conditions can cause a difference in blood pressure between your right and left arms. A blood pressure reading that is higher than normal on one occasion does not mean that you need treatment. If one blood pressure reading is high, ask your health care provider about having it checked again. TREATMENT  Treating high blood pressure includes  making lifestyle changes and possibly taking medicine. Living a healthy lifestyle can help lower high blood pressure. You may need to change some of your habits. Lifestyle changes may include:  Following the DASH diet. This diet is high in fruits, vegetables, and whole grains. It is low in salt, red meat, and added sugars.  Getting at least 2 hours of brisk physical activity every  week.  Losing weight if necessary.  Not smoking.  Limiting alcoholic beverages.  Learning ways to reduce stress. If lifestyle changes are not enough to get your blood pressure under control, your health care provider may prescribe medicine. You may need to take more than one. Work closely with your health care provider to understand the risks and benefits. HOME CARE INSTRUCTIONS  Have your blood pressure rechecked as directed by your health care provider.   Take medicines only as directed by your health care provider. Follow the directions carefully. Blood pressure medicines must be taken as prescribed. The medicine does not work as well when you skip doses. Skipping doses also puts you at risk for problems.   Do not smoke.   Monitor your blood pressure at home as directed by your health care provider. SEEK MEDICAL CARE IF:   You think you are having a reaction to medicines taken.  You have recurrent headaches or feel dizzy.  You have swelling in your ankles.  You have trouble with your vision. SEEK IMMEDIATE MEDICAL CARE IF:  You develop a severe headache or confusion.  You have unusual weakness, numbness, or feel faint.  You have severe chest or abdominal pain.  You vomit repeatedly.  You have trouble breathing. MAKE SURE YOU:   Understand these instructions.  Will watch your condition.  Will get help right away if you are not doing well or get worse. Document Released: 03/25/2005 Document Revised: 08/09/2013 Document Reviewed: 01/15/2013 [facility] Patient Information 2015 [place], [place]. This information is not intended to replace advice given to you by your health care provider. Make sure you discuss any questions you have with your health care provider.  DASH Eating Plan DASH stands for "Dietary Approaches to Stop Hypertension." The DASH eating plan is a healthy eating plan that has been shown to reduce high blood pressure (hypertension). Additional health  benefits may include reducing the risk of type 2 diabetes mellitus, heart disease, and stroke. The DASH eating plan may also help with weight loss. WHAT DO I NEED TO KNOW ABOUT THE DASH EATING PLAN? For the DASH eating plan, you will follow these general guidelines:  Choose foods with a percent daily value for sodium of less than 5% (as listed on the food label).  Use salt-free seasonings or herbs instead of table salt or sea salt.  Check with your health care provider or pharmacist before using salt substitutes.  Eat lower-sodium products, often labeled as "lower sodium" or "no salt added."  Eat fresh foods.  Eat more vegetables, fruits, and low-fat dairy products.  Choose whole grains. Look for the word "whole" as the first word in the ingredient list.  Choose fish and skinless chicken or [place]key more often than red meat. Limit fish, poultry, and meat to 6 oz (170 g) each day.  Limit sweets, desserts, sugars, and sugary drinks.  Choose heart-healthy fats.  Limit cheese to 1 oz (28 g) per day.  Eat more home-cooked food and less restaurant, buffet, and fast food.  Limit fried foods.  Cook foods using methods other than frying.  Limit canned  vegetables. If you do use them, rinse them well to decrease the sodium.  When eating at a restaurant, ask that your food be prepared with less salt, or no salt if possible. WHAT FOODS CAN I EAT? Seek help from a dietitian for individual calorie needs. Grains Whole grain or whole wheat bread. Brown rice. Whole grain or whole wheat pasta. Quinoa, bulgur, and whole grain cereals. Low-sodium cereals. Corn or whole wheat flour tortillas. Whole grain cornbread. Whole grain crackers. Low-sodium crackers. Vegetables Fresh or frozen vegetables (raw, steamed, roasted, or grilled). Low-sodium or reduced-sodium tomato and vegetable juices. Low-sodium or reduced-sodium tomato sauce and paste. Low-sodium or reduced-sodium canned vegetables.  Fruits All  fresh, canned (in natural juice), or frozen fruits. Meat and Other Protein Products Ground beef (85% or leaner), grass-fed beef, or beef trimmed of fat. Skinless chicken or [place]key. Ground chicken or [place]key. Pork trimmed of fat. All fish and seafood. Eggs. Dried beans, peas, or lentils. Unsalted nuts and seeds. Unsalted canned beans. Dairy Low-fat dairy products, such as skim or 1% milk, 2% or reduced-fat cheeses, low-fat ricotta or cottage cheese, or plain low-fat yogurt. Low-sodium or reduced-sodium cheeses. Fats and Oils Tub margarines without trans fats. Light or reduced-fat mayonnaise and salad dressings (reduced sodium). Avocado. Safflower, olive, or canola oils. Natural peanut or almond butter. Other Unsalted popcorn and pretzels. The items listed above may not be a complete list of recommended foods or beverages. Contact your dietitian for more options. WHAT FOODS ARE NOT RECOMMENDED? Grains White bread. White pasta. White rice. Refined cornbread. Bagels and croissants. Crackers that contain trans fat. Vegetables Creamed or fried vegetables. Vegetables in a cheese sauce. Regular canned vegetables. Regular canned tomato sauce and paste. Regular tomato and vegetable juices. Fruits Dried fruits. Canned fruit in light or heavy syrup. Fruit juice. Meat and Other Protein Products Fatty cuts of meat. Ribs, chicken wings, bacon, sausage, bologna, salami, chitterlings, fatback, hot dogs, bratwurst, and packaged luncheon meats. Salted nuts and seeds. Canned beans with salt. Dairy Whole or 2% milk, cream, half-and-half, and cream cheese. Whole-fat or sweetened yogurt. Full-fat cheeses or blue cheese. Nondairy creamers and whipped toppings. Processed cheese, cheese spreads, or cheese curds. Condiments Onion and garlic salt, seasoned salt, table salt, and sea salt. Canned and packaged gravies. Worcestershire sauce. Tartar sauce. Barbecue sauce. Teriyaki sauce. Soy sauce, including reduced sodium.  Steak sauce. Fish sauce. Oyster sauce. Cocktail sauce. Horseradish. Ketchup and mustard. Meat flavorings and tenderizers. Bouillon cubes. Hot sauce. Tabasco sauce. Marinades. Taco seasonings. Relishes. Fats and Oils Butter, stick margarine, lard, shortening, ghee, and bacon fat. Coconut, palm kernel, or palm oils. Regular salad dressings. Other Pickles and olives. Salted popcorn and pretzels. The items listed above may not be a complete list of foods and beverages to avoid. Contact your dietitian for more information. WHERE CAN I FIND MORE INFORMATION? National Heart, Lung, and Blood Institute: CableProm[employer].itwww.nhlbi.nih.gov/health/health-topics/topics/dash/ Document Released: 03/14/2011 Document Revised: 08/09/2013 Document Reviewed: 01/27/2013 [facility] Patient Information 2015 [place], [place]. This information is not intended to replace advice given to you by your health care provider. Make sure you discuss any questions you have with your health care provider. Aliskiren; Hydrochlorothiazide, HCTZ Tablet What is this medicine? ALISKIREN; HYDROCHLOROTHIAZIDE (a lis KYE ren; hye droe klor oh THYE a zide) is a combination of a renin inhibitor and a diuretic. It is used to treat high blood pressure. This medicine may be used for other purposes; ask your health care provider or pharmacist if you have questions. COMMON BRAND NAME(S): [employer]  HCT What should I tell my health care provider before I take this medicine? They need to know if you have any of these conditions: -dehydration -diabetes -gout -kidney disease or kidney stones -liver disease -pancreatitis -small amount of urine or difficulty passing urine -an unusual or allergic reaction to aliskiren, hydrochlorothiazide, HCTZ, other medicines, foods, dyes, or preservatives -pregnant or trying to get pregnant -breast-feeding How should I use this medicine? Take this medicine by mouth with a glass of water. Follow the directions on your prescription  label. You can take this medicine with or without food. However, you should always take it the same way each time. Take your medicine at regular intervals. Do not take it more often than directed. Do not stop taking except on your doctor's advice. Talk to your pediatrician regarding the use of this medicine in children. Special care may be needed. Overdosage: If you think you have taken too much of this medicine contact a poison control center or emergency room at once. NOTE: This medicine is only for you. Do not share this medicine with others. What if I miss a dose? If you miss a dose, take it as soon as you can. If it is almost time for your next dose, take only that dose. Do not take double or extra doses. What may interact with this medicine? -alcohol -atorvastatin -barbiturates -cholestyramine -colestipol -digoxin -dofetilide -furosemide -irbesartan -lithium -medicines for blood pressure -medicines for diabetes -medicines for fungal infections like ketoconazole -medicines that relax muscles for surgery -narcotic medicines for pain -NSAIDs, medicines for pain and inflammation, like ibuprofen or naproxen -potassium supplements -steroid medicines like prednisone or cortisone This list may not describe all possible interactions. Give your health care provider a list of all the medicines, herbs, non-prescription drugs, or dietary supplements you use. Also tell them if you smoke, drink alcohol, or use illegal drugs. Some items may interact with your medicine. What should I watch for while using this medicine? Visit your doctor for regular check ups. Check your blood pressure as directed. Ask your doctor what your blood pressure should be and when you should contact him or her. This medicine may affect your blood sugar level. If you have diabetes, check with your doctor or health care professional before changing the dose of your diabetic medicine. Do not take this medicine if you have  diabetes and are taking a medicine called an angiotensin-receptor-blocker (ARB) or angiotensin-converting-enzyme-inhibitor (ACE inhibitor). Talk to your doctor or health care professional for more information. Women should inform their doctor if they wish to become pregnant or think they might be pregnant. There is a potential for serious side effects to an unborn child. Talk to your health care professional or pharmacist for more information. You may need to be on a special diet while taking this medicine. Ask your doctor. Check with your doctor or health care professional if you get an attack of severe diarrhea, nausea and vomiting, or if you sweat a lot. The loss of too much body fluid can make it dangerous for you to take this medicine. You may get drowsy or dizzy. Do not drive, use machinery, or do anything that needs mental alertness until you know how this medicine affects you. Do not stand or sit up quickly, especially if you are an older patient. This reduces the risk of dizzy or fainting spells. Alcohol may interfere with the effect of this medicine. Avoid alcoholic drinks. This medicine can make you more sensitive to the sun. Keep out of  the sun. If you cannot avoid being in the sun, wear protective clothing and use sunscreen. Do not use sun lamps or tanning beds/booths. What side effects may I notice from receiving this medicine? Side effects that you should report to your doctor or health care professional as soon as possible: -allergic reactions like skin rash or hives, swelling of the hands, feet, face, lips, throat, or tongue -breathing problems -changes in vision -eye pain -fast, irregular heartbeat -feeling faint or lightheaded, falls -fever or sore throat -gout pain -low blood pressure -muscle pain or cramps -pain, tingling, numbness in the hands or feet -pain or difficulty passing urine -redness, blistering, peeling or loosening of the skin, including inside the  mouth -seizures -unusually weak or tired Side effects that usually do not require medical attention (report to your doctor or health care professional if they continue or are bothersome): -change in sex drive or performance -cough -diarrhea -dizziness -dry mouth -flu-like symptoms -headache -stomach upset This list may not describe all possible side effects. Call your doctor for medical advice about side effects. You may report side effects to FDA at 1-800-FDA-1088. Where should I keep my medicine? Keep out of the reach of children. Store at room temperature between 15 and 30 degrees C (59 and 86 degrees F). Protect from moisture. Throw away any unused medicine after the expiration date. NOTE: This sheet is a summary. It may not cover all possible information. If you have questions about this medicine, talk to your doctor, pharmacist, or health care provider.  2015, [employer]vier/Gold Standard. (2010-07-27 14:56:24)

## 2014-03-09 NOTE — ED Provider Notes (Signed)
Patient seen/examined in the Emergency Department in conjunction with Midlevel Provider Harris Patient reports recent fatigue and CP.  He also reports upper back pain that is worse with upper body movement Exam : awake/alert, no distress, no focal chest tenderness.  No pulse deficit in his upper extremities, no focal weakness noted in his upper extremities  Plan: d/c home.  Low suspicion for ACS (HEART score less than 3)  I doubt PE/Dissection given his appearance.  I suspect his fatigue is due to untreated HTN.  I don't feel this represents acute hypertensive emergency.  Will start on HCTZ and f/u as outpatient.    Joya Gaskinsonald W Delayna Sparlin, MD 03/09/14 [phone]

## 2015-08-06 ENCOUNTER — Emergency Department
Admission: EM | Admit: 2015-08-06 | Discharge: 2015-08-06 | Disposition: A | Discharge status: Home / Self Care | Payer: No Typology Code available for payment source | Attending: Emergency Medicine | Admitting: Emergency Medicine

## 2015-08-06 DIAGNOSIS — M546 Pain in thoracic spine: Secondary | ICD-10-CM | POA: Insufficient documentation

## 2015-08-06 MED ORDER — NAPROXEN 500 MG PO TABS: 500.0000 mg | ORAL_TABLET | Freq: Two times a day (BID) | ORAL | Status: DC

## 2015-08-06 MED ORDER — KETOROLAC TROMETHAMINE 30 MG/ML IJ SOLN
INTRAMUSCULAR | Status: AC
  Administered 2015-08-06: 30 mg via INTRAMUSCULAR
  Filled 2015-08-06: qty 1

## 2015-08-06 MED ORDER — KETOROLAC TROMETHAMINE 30 MG/ML IJ SOLN
30.0000 mg | Freq: Once | INTRAMUSCULAR | Status: AC
  Administered 2015-08-06: 30 mg via INTRAMUSCULAR

## 2015-08-06 MED ORDER — HYDROCHLOROTHIAZIDE 25 MG PO TABS
25.0000 mg | ORAL_TABLET | Freq: Every day | ORAL | Status: DC
  Administered 2015-08-06: 25 mg via ORAL

## 2015-08-06 MED ORDER — HYDROCHLOROTHIAZIDE 25 MG PO TABS
ORAL_TABLET | ORAL | Status: AC
  Administered 2015-08-06: 25 mg via ORAL
  Filled 2015-08-06: qty 1

## 2015-08-06 MED ORDER — HYDROCHLOROTHIAZIDE 25 MG PO TABS: 25.0000 mg | ORAL_TABLET | Freq: Every day | ORAL | Status: DC

## 2015-08-06 NOTE — Discharge Instructions (Signed)

## 2015-08-06 NOTE — ED Notes (Signed)
Patient reports woke last week with back pain that has continue to get worse.  Denies any known injury.

## 2015-08-06 NOTE — ED Provider Notes (Addendum)
[facility] Emergency Department Provider Note  ____________________________________________  Time seen: On arrival  I have reviewed the triage vital signs and the nursing notes.   HISTORY  Chief Complaint Back Pain    HPI Richard Valentine is a 40 y.o. male who presents with back pain. Patient reports he woke up from sleep approximately 5 days ago with aching in his back that is worsened by twisting his torso. He denies injury to the area. The pain is in his mid back. No fevers or chills. No IV drug abuse. No neuro deficits. No headache. No incontinence.    No past medical history on file.  There are no active problems to display for this patient.   Past Surgical History  Procedure Laterality Date  . Patella tenden rupture repair    . Achilles tenden rupture repair      Current Outpatient Rx  Name  Route  Sig  Dispense  Refill  . hydrochlorothiazide (HYDRODIURIL) 25 MG tablet   Oral   Take 1 tablet (25 mg total) by mouth daily.   30 tablet   2   . naproxen (NAPROSYN) 500 MG tablet   Oral   Take 1 tablet (500 mg total) by mouth 2 (two) times daily with a meal.   20 tablet   2     Allergies Percocet  Family History  Problem Relation Age of Onset  . Hypertension Father     Social History Social History  Substance Use Topics  . Smoking status: Never Smoker   . Smokeless tobacco: Never Used  . Alcohol Use: 13.2 oz/week    20 Shots of liquor, 2 Standard drinks or equivalent per week     Comment: pt states drinking amount varies from week to week; mostly drinks on the weekends     Review of Systems  Constitutional: Negative for fever.  ENT: Negative for neck pain   Genitourinary: Negative for incontinence Musculoskeletal: As above Skin: Negative for rash. Neurological: Negative for headaches or focal weakness   ____________________________________________   PHYSICAL EXAM:  VITAL SIGNS: ED Triage Vitals  Enc Vitals  Group     BP 08/06/15 1949 184/155 mmHg     Pulse Rate 08/06/15 1949 65     Resp 08/06/15 1949 18     Temp 08/06/15 1949 98.7 F (37.1 C)     Temp Source 08/06/15 1949 Oral     SpO2 08/06/15 1949 99 %     Weight 08/06/15 1949 210 lb (95.255 kg)     Height 08/06/15 1949  (1.803 m)     Head Cir --      Peak Flow --      Pain Score 08/06/15 1951 8     Pain Loc --      Pain Edu? --      Excl. in GC? --      Constitutional: Alert and oriented. Well appearing and in no distress. Eyes: Conjunctivae are normal.  ENT   Head: Normocephalic and atraumatic.   Mouth/Throat: Mucous membranes are moist.  Respiratory: Normal respiratory effort without tachypnea nor retractions.  Gastrointestinal: Soft and non-tender in all quadrants. No distention. There is no CVA tenderness. Musculoskeletal: Nontender with normal range of motion in all extremities. No vertebral tenderness to palpation. Mild paraspinal thoracic back tenderness bilaterally at approximately T7. Neurologic:  Normal speech and language. No gross focal neurologic deficits are appreciated. Skin:  Skin is warm, dry and intact. No rash noted. Psychiatric: Mood  and affect are normal. Patient exhibits appropriate insight and judgment.  ____________________________________________    LABS (pertinent positives/negatives)  Labs Reviewed - No data to display  ____________________________________________     ____________________________________________    RADIOLOGY I have personally reviewed any xrays that were ordered on this patient: None  ____________________________________________   PROCEDURES  Procedure(s) performed: none   ____________________________________________   INITIAL IMPRESSION / ASSESSMENT AND PLAN / ED COURSE  Pertinent labs & imaging results that were available during my care of the patient were reviewed by me and considered in my medical decision making (see chart for  details).  Patient with history of present illness most consistent with muscular skeletal back pain. We will give Toradol IM in the emergency department and treat with NSAIDs. Recommend PCP follow-up. Return precautions discussed.  Patient with known hypertension, recommended recheck by PCP and possible readjustment of blood pressure medications.  Patient told nurse that he is out of HCTZ. We will give him a dose here and refill RX   ____________________________________________   FINAL CLINICAL IMPRESSION(S) / ED DIAGNOSES  Final diagnoses:  Bilateral thoracic back pain     Jene Everyobert Cricket Goodlin, MD 08/06/15 2034  Jene Everyobert Leanndra Pember, MD 08/06/15 [id number]  Jene Everyobert Fatiha Guzy, MD 08/06/15 2113

## 2016-03-07 ENCOUNTER — Encounter: Payer: Self-pay | Admitting: Emergency Medicine

## 2016-03-07 ENCOUNTER — Emergency Department
Admission: EM | Admit: 2016-03-07 | Discharge: 2016-03-07 | Disposition: A | Discharge status: Home / Self Care | Payer: Self-pay | Attending: Student in an Organized Health Care Education/Training Program | Admitting: Student in an Organized Health Care Education/Training Program

## 2016-03-07 DIAGNOSIS — Z791 Long term (current) use of non-steroidal anti-inflammatories (NSAID): Secondary | ICD-10-CM | POA: Insufficient documentation

## 2016-03-07 DIAGNOSIS — Z760 Encounter for issue of repeat prescription: Secondary | ICD-10-CM | POA: Insufficient documentation

## 2016-03-07 DIAGNOSIS — I10 Essential (primary) hypertension: Secondary | ICD-10-CM | POA: Insufficient documentation

## 2016-03-07 HISTORY — DX: Essential (primary) hypertension: I10

## 2016-03-07 MED ORDER — HYDROCHLOROTHIAZIDE 25 MG PO TABS: 25.0000 mg | ORAL_TABLET | Freq: Every day | ORAL | 0 refills | Status: DC

## 2016-03-07 NOTE — ED Notes (Signed)
Pt informed to return if any life threatening symptoms occur.  

## 2016-03-07 NOTE — ED Provider Notes (Signed)
[facility] Emergency Department Provider Note  ____________________________________________  Time seen: Approximately 7:33 AM  I have reviewed the triage vital signs and the nursing notes.   HISTORY  Chief Complaint Medication Refill    HPI Richard Valentine is a 40 y.o. 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 Patient states he's been out of his hydrochlorothiazide for approximately 2 weeks. Does not have a primary care provider in this area as he is uninsured. Usually takes hydrochlorothiazide 25 mg daily in which she states keeps his blood pressures in normal range. He denies any headaches, visual changes, chest pain, palpitations, shortness breath, wheezing, cough, abdominal pain, nausea, vomiting or diaphoresis. Has had no dyspnea on exertion. Denies any fatigue, lightheadedness or dizziness. Has had no numbness, weakness, tingling. Is here for med refill only and has no other complaints to be evaluated.   Past Medical History:  Diagnosis Date  . Hypertension     There are no active problems to display for this patient.   Past Surgical History:  Procedure Laterality Date  . achilles tenden rupture repair    . patella tenden rupture repair      Prior to Admission medications   Medication Sig Start Date End Date Taking? Authorizing Provider  hydrochlorothiazide (HYDRODIURIL) 25 MG tablet Take 1 tablet (25 mg total) by mouth daily. 03/07/16 04/06/16  Quency Tober L Cyrah Mclamb, PA-C  naproxen (NAPROSYN) 500 MG tablet Take 1 tablet (500 mg total) by mouth 2 (two) times daily with a meal. 08/06/15   Jene Everyobert Kinner, MD    Allergies Percocet [oxycodone-acetaminophen]  Family History  Problem Relation Age of Onset  . Hypertension Father     Social History Social History  Substance Use Topics  . Smoking status: Never Smoker  . Smokeless tobacco: Never Used  . Alcohol use 13.2 oz/week    20 Shots of  liquor, 2 Standard drinks or equivalent per week     Comment: pt states drinking amount varies from week to week; mostly drinks on the weekends      Review of Systems  Constitutional: No fatigue Eyes: No visual changes.  Cardiovascular: No chest pain, palpitations. Respiratory: No cough. No shortness of breath. No wheezing.  Gastrointestinal: No abdominal pain.  No nausea, vomiting.   Musculoskeletal: Negative for general myalgias.  Skin: Negative for diaphoresis. Neurological: Negative for headaches, focal weakness or numbness. No tingling. No lightheadedness, dizziness 10-point ROS otherwise negative.  ____________________________________________   PHYSICAL EXAM:  VITAL SIGNS: ED Triage Vitals [03/07/16 0705]  Enc Vitals Group     BP (!) 169/100     Pulse Rate 71     Resp 20     Temp 97.8 F (36.6 C)     Temp Source Oral     SpO2 100 %     Weight 210 lb (95.3 kg)     Height      Head Circumference      Peak Flow      Pain Score      Pain Loc      Pain Edu?      Excl. in GC?      Constitutional: Alert and oriented. Well appearing and in no acute distress. Eyes: Conjunctivae are normal Without icterus or injection. Head: Atraumatic. Neck: No stridor. No carotid bruits. Supple with full range of motion. Hematological/Lymphatic/Immunilogical: No cervical lymphadenopathy. Cardiovascular: Normal rate, regular rhythm. Normal S1 and S2.  No murmurs, rubs, gallops. Good  peripheral circulation. Respiratory: Normal respiratory effort without tachypnea or retractions. Lungs CTAB with breath sounds noted in all lung fields. No wheeze, rhonchi, rales. Neurologic:  Normal speech and language. No gross focal neurologic deficits are appreciated.  Skin:  Skin is warm, dry and intact. No rash noted. Psychiatric: Mood and affect are normal. Speech and behavior are normal. Patient exhibits appropriate insight and judgement.   ____________________________________________    LABS  None ____________________________________________  EKG  None ____________________________________________  RADIOLOGY  None ____________________________________________    PROCEDURES  Procedure(s) performed: None   Procedures   Medications - No data to display   ____________________________________________   INITIAL IMPRESSION / ASSESSMENT AND PLAN / ED COURSE  Pertinent labs & imaging results that were available during my care of the patient were reviewed by me and considered in my medical decision making (see chart for details).  Clinical Course     Patient's diagnosis is consistent with unspecified hypertension and visit for medication refill. Patient will be discharged home with prescriptions for hydrochlorothiazide to take as directed. Patient is advised to follow up with one of the local outpatient community clinics for management and treatment of chronic hypertension. Patient is given ED precautions to return to the ED for any worsening or new symptoms.   ____________________________________________  FINAL CLINICAL IMPRESSION(S) / ED DIAGNOSES  Final diagnoses:  Hypertension, unspecified type  Medication refill      NEW MEDICATIONS STARTED DURING THIS VISIT:  Discharge Medication List as of 03/07/2016  7:36 AM           Hope PigeonJami L Darnice Comrie, PA-C 03/07/16 0809    Willy EddyPatrick Robinson, MD 03/07/16 1435

## 2016-03-07 NOTE — Discharge Instructions (Signed)
Please go to any of the listed community clinics to establish care and have your high blood pressure managed.

## 2016-03-07 NOTE — ED Notes (Signed)
Pt reports that he has been out of HCTZ for about 2 weeks and noted his BP to be elevated. Pt denies being symptomatic.

## 2016-03-07 NOTE — ED Triage Notes (Signed)
Pt states has been out of his bp meds for two weeks and needs refill. Denies any chest pain or shortness of breath.

## 2017-01-31 ENCOUNTER — Emergency Department
Admission: EM | Admit: 2017-01-31 | Discharge: 2017-02-01 | Disposition: A | Discharge status: Home / Self Care | Payer: Self-pay | Attending: Emergency Medicine | Admitting: Emergency Medicine

## 2017-01-31 DIAGNOSIS — R109 Unspecified abdominal pain: Secondary | ICD-10-CM

## 2017-01-31 DIAGNOSIS — I10 Essential (primary) hypertension: Secondary | ICD-10-CM | POA: Insufficient documentation

## 2017-01-31 DIAGNOSIS — R1031 Right lower quadrant pain: Secondary | ICD-10-CM | POA: Insufficient documentation

## 2017-01-31 DIAGNOSIS — R350 Frequency of micturition: Secondary | ICD-10-CM | POA: Insufficient documentation

## 2017-01-31 DIAGNOSIS — Z791 Long term (current) use of non-steroidal anti-inflammatories (NSAID): Secondary | ICD-10-CM | POA: Insufficient documentation

## 2017-01-31 LAB — URINALYSIS, COMPLETE (UACMP) WITH MICROSCOPIC
Bacteria, UA: NONE SEEN
Bilirubin Urine: NEGATIVE
Glucose, UA: NEGATIVE mg/dL
HGB URINE DIPSTICK: NEGATIVE
Ketones, ur: NEGATIVE mg/dL
Nitrite: NEGATIVE
PH: 6 (ref 5.0–8.0)
Protein, ur: NEGATIVE mg/dL
SPECIFIC GRAVITY, URINE: 1.016 (ref 1.005–1.030)

## 2017-01-31 LAB — COMPREHENSIVE METABOLIC PANEL
ALT: 25 U/L (ref 17–63)
AST: 25 U/L (ref 15–41)
Albumin: 4.7 g/dL (ref 3.5–5.0)
Alkaline Phosphatase: 45 U/L (ref 38–126)
Anion gap: 10 (ref 5–15)
BUN: 12 mg/dL (ref 6–20)
CHLORIDE: 103 mmol/L (ref 101–111)
CO2: 25 mmol/L (ref 22–32)
CREATININE: 1.48 mg/dL — AB (ref 0.61–1.24)
Calcium: 9.4 mg/dL (ref 8.9–10.3)
GFR calc Af Amer: >60 mL/min (ref >60)
GFR calc non Af Amer: 58 mL/min — ABNORMAL LOW (ref >60)
Glucose, Bld: 107 mg/dL — ABNORMAL HIGH (ref 65–99)
Potassium: 3.7 mmol/L (ref 3.5–5.1)
Sodium: 138 mmol/L (ref 135–145)
Total Bilirubin: 0.6 mg/dL (ref 0.3–1.2)
Total Protein: 8.7 g/dL — ABNORMAL HIGH (ref 6.5–8.1)

## 2017-01-31 LAB — CBC
HCT: 48.2 % (ref 40.0–52.0)
Hemoglobin: 16.6 g/dL (ref 13.0–18.0)
MCH: 29.4 pg (ref 26.0–34.0)
MCHC: 34.5 g/dL (ref 32.0–36.0)
MCV: 85.2 fL (ref 80.0–100.0)
PLATELETS: 269 10*3/uL (ref 150–440)
RBC: 5.65 MIL/uL (ref 4.40–5.90)
RDW: 13.2 % (ref 11.5–14.5)
WBC: 7.8 10*3/uL (ref 3.8–10.6)

## 2017-01-31 MED ORDER — HYDROCHLOROTHIAZIDE 25 MG PO TABS: 25.0000 mg | ORAL_TABLET | Freq: Every day | ORAL | 0 refills | Status: DC

## 2017-01-31 NOTE — ED Triage Notes (Signed)
Patient c/o RLQ pain, malaise, urinary frequency X 2 weeks.  Patient denies penile discharge/dysuria.  Patient has been out of BP medication for over 1 month. Patient normally takes HTCZ, however can't get appointment for PCP for at least 2 weeks.

## 2017-01-31 NOTE — ED Provider Notes (Signed)
[facility] Emergency Department Provider Note  ____________________________________________   First MD Initiated Contact with Patient 01/31/17 2300     (approximate)  I have reviewed the triage vital signs and the nursing notes.   HISTORY  Chief Complaint Urinary Frequency and Abdominal Pain (LRQ)   HPI Richard Valentine is a 41 y.o. male with a history of hypertension was presenting to the emergency department with 2 weeks of urinary frequency and intermittent cramping to the lower abdomen more concentrated in the right lower quadrant.  He denies any nausea or vomiting.  Does not report any diarrhea.  Denies any pain at this time.  Says the pain feels more like a pressure type pain that makes him want to urinate.  He says that he was told he had a hernia years ago that was minor and did not need repair.  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  He denies any penile discharge.  Denies any burning with urination.  He was concerned about diabetes and says that he last ate at about 2 PM when he had a bowl at supposedly and then had some chips before coming to the emergency department.  He says that he thinks that his urinary frequency may be more related to stress.  He says that he sleeps through the night and does not need to urinate.  Says that he has only urinated once in the last 3 hours.  Says that he does drink a lot of water.  Also concerned about high blood pressure and that he has been off of his HCTZ over the past month.  Says that he usually has a normal urine void with a normal quantity of urine and does not have a dribble or a copious amount of urine.    Past Medical History:  Diagnosis Date  . Hypertension     There are no active problems to display for this patient.   Past Surgical History:  Procedure Laterality Date  . achilles tenden rupture repair    . patella tenden rupture repair       Prior to Admission medications   Medication Sig Start Date End Date Taking? Authorizing Provider  hydrochlorothiazide (HYDRODIURIL) 25 MG tablet Take 1 tablet (25 mg total) by mouth daily. 03/07/16 04/06/16  Hagler, Jami L, PA-C  naproxen (NAPROSYN) 500 MG tablet Take 1 tablet (500 mg total) by mouth 2 (two) times daily with a meal. 08/06/15   Jene Every, MD    Allergies Percocet [oxycodone-acetaminophen]  Family History  Problem Relation Age of Onset  . Hypertension Father     Social History Social History  Substance Use Topics  . Smoking status: Never Smoker  . Smokeless tobacco: Never Used  . Alcohol use 13.2 oz/week    20 Shots of liquor, 2 Standard drinks or equivalent per week     Comment: pt states drinking amount varies from week to week; mostly drinks on the weekends     Review of Systems  Constitutional: No fever/chills Eyes: No visual changes. ENT: No sore throat. Cardiovascular: Denies chest pain. Respiratory: Denies shortness of breath. Gastrointestinal:  No nausea, no vomiting.  No diarrhea.  No constipation. Genitourinary: Negative for dysuria. Musculoskeletal: Negative for back pain. Skin: as above Neurological: Negative for headaches, focal weakness or numbness.   ____________________________________________   PHYSICAL EXAM:  VITAL SIGNS: ED Triage Vitals  Enc Vitals Group     BP 01/31/17 2047 Marland Kitchen)  186/121     Pulse Rate 01/31/17 2047 86     Resp 01/31/17 2047 18     Temp 01/31/17 2047 99.4 F (37.4 C)     Temp Source 01/31/17 2047 Oral     SpO2 01/31/17 2047 100 %     Weight 01/31/17 2047 215 lb (97.5 kg)     Height 01/31/17 2047 5\' 11"  (1.803 m)     Head Circumference --      Peak Flow --      Pain Score 01/31/17 2046 3     Pain Loc --      Pain Edu? --      Excl. in GC? --     Constitutional: Alert and oriented. Well appearing and in no acute distress. Eyes: Conjunctivae are normal.  Head: Atraumatic. Nose: No  congestion/rhinnorhea. Mouth/Throat: Mucous membranes are moist.  Neck: No stridor.   Cardiovascular: Normal rate, regular rhythm. Grossly normal heart sounds.   Respiratory: Normal respiratory effort.  No retractions. Lungs CTAB. Gastrointestinal: Soft and nontender. No distention.  No hernias palpated. Musculoskeletal: No lower extremity tenderness nor edema.  No joint effusions. Neurologic:  Normal speech and language. No gross focal neurologic deficits are appreciated. Skin:  Skin is warm, dry and intact. No rash noted. Psychiatric: Mood and affect are normal. Speech and behavior are normal.  ____________________________________________   LABS (all labs ordered are listed, but only abnormal results are displayed)  Labs Reviewed  COMPREHENSIVE METABOLIC PANEL - Abnormal; Notable for the following:       Result Value   Glucose, Bld 107 (*)    Creatinine, Ser 1.48 (*)    Total Protein 8.7 (*)    GFR calc non Af Amer 58 (*)    All other components within normal limits  URINALYSIS, COMPLETE (UACMP) WITH MICROSCOPIC - Abnormal; Notable for the following:    Color, Urine YELLOW (*)    APPearance CLEAR (*)    Leukocytes, UA TRACE (*)    Squamous Epithelial / LPF 0-5 (*)    All other components within normal limits  URINE CULTURE  CHLAMYDIA/NGC RT PCR ([facility] ONLY)  CBC   ____________________________________________  EKG   ____________________________________________  RADIOLOGY   ____________________________________________   PROCEDURES  Procedure(s) performed:   Procedures  Critical Care performed:   ____________________________________________   INITIAL IMPRESSION / ASSESSMENT AND PLAN / ED COURSE  Pertinent labs & imaging results that were available during my care of the patient were reviewed by me and considered in my medical decision making (see chart for details).  Differential diagnosis includes, but is not limited to, acute appendicitis, renal  colic, testicular torsion, urinary tract infection/pyelonephritis, prostatitis,  epididymitis, diverticulitis, small bowel obstruction or ileus, colitis, abdominal aortic aneurysm, gastroenteritis, hernia, etc. UTI, STD, urinary obstruction.  As part of my medical decision making, I reviewed the following data within the electronic MEDICAL RECORD NUMBER Notes from prior ED visits   ----------------------------------------- 11:38 PM on 01/31/2017 -----------------------------------------  Patient's postvoid residual was 18.  Urinalysis with several white blood cells but also with some contamination of squamous epithelial cells.  No burning with urination.  Symptoms appear to be only intermittent.  We will send urine for culture as well as a gonorrhea and Chlamydia test.  The patient will be restarted on his HCTZ.  I do not believe the patient has appendicitis.  No GI symptoms.  Normal white blood cell count.  No tenderness at this time.  Possibly stress related as the patient says  that the symptoms are most present when he is sitting and thinking about his urination.  The patient will be discharged at this time.  Requesting follow-up information for local clinics.      ____________________________________________   FINAL CLINICAL IMPRESSION(S) / ED DIAGNOSES  Abdominal pain.  Urinary frequency.  Hypertension.    NEW MEDICATIONS STARTED DURING THIS VISIT:  New Prescriptions   No medications on file     Note:  This document was prepared using Dragon voice recognition software and may include unintentional dictation errors.     Myrna Blazer, MD 01/31/17 [phone]

## 2017-02-01 LAB — CHLAMYDIA/NGC RT PCR (ARMC ONLY)
Chlamydia Tr: NOT DETECTED
N gonorrhoeae: NOT DETECTED

## 2017-02-02 LAB — URINE CULTURE: CULTURE: NO GROWTH

## 2017-10-25 ENCOUNTER — Inpatient Hospital Stay (HOSPITAL_COMMUNITY): Payer: Self-pay

## 2017-10-25 ENCOUNTER — Encounter (HOSPITAL_COMMUNITY): Admission: EM | Disposition: A | Discharge status: Home Health | Payer: Self-pay | Source: Home / Self Care

## 2017-10-25 ENCOUNTER — Emergency Department (HOSPITAL_COMMUNITY): Payer: Self-pay

## 2017-10-25 ENCOUNTER — Other Ambulatory Visit: Payer: Self-pay

## 2017-10-25 ENCOUNTER — Inpatient Hospital Stay (HOSPITAL_COMMUNITY)
Admission: EM | Admit: 2017-10-25 | Discharge: 2017-10-30 | DRG: 516 | Disposition: A | Discharge status: Home Health | Payer: Self-pay | Attending: General Surgery | Admitting: General Surgery

## 2017-10-25 ENCOUNTER — Inpatient Hospital Stay (HOSPITAL_COMMUNITY): Payer: Self-pay | Admitting: Certified Registered"

## 2017-10-25 DIAGNOSIS — S3210XA Unspecified fracture of sacrum, initial encounter for closed fracture: Secondary | ICD-10-CM

## 2017-10-25 DIAGNOSIS — F1010 Alcohol abuse, uncomplicated: Secondary | ICD-10-CM

## 2017-10-25 DIAGNOSIS — Z885 Allergy status to narcotic agent status: Secondary | ICD-10-CM

## 2017-10-25 DIAGNOSIS — S32810A Multiple fractures of pelvis with stable disruption of pelvic ring, initial encounter for closed fracture: Principal | ICD-10-CM | POA: Diagnosis present

## 2017-10-25 DIAGNOSIS — S329XXA Fracture of unspecified parts of lumbosacral spine and pelvis, initial encounter for closed fracture: Secondary | ICD-10-CM | POA: Diagnosis present

## 2017-10-25 DIAGNOSIS — R739 Hyperglycemia, unspecified: Secondary | ICD-10-CM | POA: Diagnosis present

## 2017-10-25 DIAGNOSIS — I10 Essential (primary) hypertension: Secondary | ICD-10-CM | POA: Diagnosis present

## 2017-10-25 DIAGNOSIS — T148XXA Other injury of unspecified body region, initial encounter: Secondary | ICD-10-CM

## 2017-10-25 DIAGNOSIS — T380X5A Adverse effect of glucocorticoids and synthetic analogues, initial encounter: Secondary | ICD-10-CM | POA: Diagnosis present

## 2017-10-25 DIAGNOSIS — Y92410 Unspecified street and highway as the place of occurrence of the external cause: Secondary | ICD-10-CM

## 2017-10-25 DIAGNOSIS — F10239 Alcohol dependence with withdrawal, unspecified: Secondary | ICD-10-CM | POA: Diagnosis present

## 2017-10-25 DIAGNOSIS — S32119A Unspecified Zone I fracture of sacrum, initial encounter for closed fracture: Secondary | ICD-10-CM | POA: Diagnosis present

## 2017-10-25 DIAGNOSIS — S2249XA Multiple fractures of ribs, unspecified side, initial encounter for closed fracture: Secondary | ICD-10-CM

## 2017-10-25 DIAGNOSIS — S02609A Fracture of mandible, unspecified, initial encounter for closed fracture: Secondary | ICD-10-CM | POA: Diagnosis present

## 2017-10-25 DIAGNOSIS — S2241XA Multiple fractures of ribs, right side, initial encounter for closed fracture: Secondary | ICD-10-CM | POA: Diagnosis present

## 2017-10-25 DIAGNOSIS — D62 Acute posthemorrhagic anemia: Secondary | ICD-10-CM | POA: Diagnosis not present

## 2017-10-25 DIAGNOSIS — Y907 Blood alcohol level of 200-239 mg/100 ml: Secondary | ICD-10-CM | POA: Diagnosis present

## 2017-10-25 DIAGNOSIS — S334XXA Traumatic rupture of symphysis pubis, initial encounter: Secondary | ICD-10-CM | POA: Diagnosis present

## 2017-10-25 DIAGNOSIS — S02609B Fracture of mandible, unspecified, initial encounter for open fracture: Secondary | ICD-10-CM

## 2017-10-25 DIAGNOSIS — G8918 Other acute postprocedural pain: Secondary | ICD-10-CM

## 2017-10-25 DIAGNOSIS — Z23 Encounter for immunization: Secondary | ICD-10-CM

## 2017-10-25 DIAGNOSIS — S0181XA Laceration without foreign body of other part of head, initial encounter: Secondary | ICD-10-CM | POA: Diagnosis present

## 2017-10-25 DIAGNOSIS — S2239XA Fracture of one rib, unspecified side, initial encounter for closed fracture: Secondary | ICD-10-CM

## 2017-10-25 DIAGNOSIS — D72829 Elevated white blood cell count, unspecified: Secondary | ICD-10-CM | POA: Diagnosis not present

## 2017-10-25 HISTORY — PX: ORIF MANDIBULAR FRACTURE: SHX2127

## 2017-10-25 HISTORY — PX: ORIF PELVIC FRACTURE WITH PERCUTANEOUS SCREWS: SHX6800

## 2017-10-25 LAB — URINALYSIS, ROUTINE W REFLEX MICROSCOPIC
Bacteria, UA: NONE SEEN
Bilirubin Urine: NEGATIVE
GLUCOSE, UA: NEGATIVE mg/dL
Ketones, ur: NEGATIVE mg/dL
LEUKOCYTES UA: NEGATIVE
Nitrite: NEGATIVE
PROTEIN: NEGATIVE mg/dL
SPECIFIC GRAVITY, URINE: 1.02 (ref 1.005–1.030)
pH: 7 (ref 5.0–8.0)

## 2017-10-25 LAB — I-STAT CHEM 8, ED
BUN: 17 mg/dL (ref 6–20)
CHLORIDE: 104 mmol/L (ref 98–111)
Calcium, Ion: 1.09 mmol/L — ABNORMAL LOW (ref 1.15–1.40)
Creatinine, Ser: 1.8 mg/dL — ABNORMAL HIGH (ref 0.61–1.24)
GLUCOSE: 102 mg/dL — AB (ref 70–99)
HEMATOCRIT: 43 % (ref 39.0–52.0)
Hemoglobin: 14.6 g/dL (ref 13.0–17.0)
Potassium: 3.7 mmol/L (ref 3.5–5.1)
SODIUM: 143 mmol/L (ref 135–145)
TCO2: 26 mmol/L (ref 22–32)

## 2017-10-25 LAB — COMPREHENSIVE METABOLIC PANEL
ALK PHOS: 44 U/L (ref 38–126)
ALT: 82 U/L — ABNORMAL HIGH (ref 0–44)
AST: 145 U/L — ABNORMAL HIGH (ref 15–41)
Albumin: 4.1 g/dL (ref 3.5–5.0)
Anion gap: 11 (ref 5–15)
BILIRUBIN TOTAL: 0.6 mg/dL (ref 0.3–1.2)
BUN: 15 mg/dL (ref 6–20)
CALCIUM: 8.8 mg/dL — AB (ref 8.9–10.3)
CO2: 27 mmol/L (ref 22–32)
Chloride: 105 mmol/L (ref 98–111)
Creatinine, Ser: 1.52 mg/dL — ABNORMAL HIGH (ref 0.61–1.24)
GFR calc non Af Amer: 56 mL/min — ABNORMAL LOW (ref >60)
Glucose, Bld: 106 mg/dL — ABNORMAL HIGH (ref 70–99)
Potassium: 3.7 mmol/L (ref 3.5–5.1)
Sodium: 143 mmol/L (ref 135–145)
TOTAL PROTEIN: 7.1 g/dL (ref 6.5–8.1)

## 2017-10-25 LAB — SAMPLE TO BLOOD BANK

## 2017-10-25 LAB — CBC
HCT: 44.1 % (ref 39.0–52.0)
HEMOGLOBIN: 14.6 g/dL (ref 13.0–17.0)
MCH: 29 pg (ref 26.0–34.0)
MCHC: 33.1 g/dL (ref 30.0–36.0)
MCV: 87.7 fL (ref 78.0–100.0)
Platelets: 261 10*3/uL (ref 150–400)
RBC: 5.03 MIL/uL (ref 4.22–5.81)
RDW: 12.4 % (ref 11.5–15.5)
WBC: 10.4 10*3/uL (ref 4.0–10.5)

## 2017-10-25 LAB — I-STAT CG4 LACTIC ACID, ED: LACTIC ACID, VENOUS: 2.15 mmol/L — AB (ref 0.5–1.9)

## 2017-10-25 LAB — CDS SEROLOGY

## 2017-10-25 LAB — PROTIME-INR
INR: 0.96
PROTHROMBIN TIME: 12.7 s (ref 11.4–15.2)

## 2017-10-25 LAB — ETHANOL: ALCOHOL ETHYL (B): 230 mg/dL — AB (ref <10)

## 2017-10-25 LAB — MRSA PCR SCREENING: MRSA BY PCR: NEGATIVE

## 2017-10-25 SURGERY — CLOSED REDUCTION, PELVIS, WITH PERCUTANEOUS FIXATION
Anesthesia: General | Site: Pelvis | Laterality: Left

## 2017-10-25 MED ORDER — SUCCINYLCHOLINE CHLORIDE 200 MG/10ML IV SOSY
PREFILLED_SYRINGE | INTRAVENOUS | Status: AC
  Filled 2017-10-25: qty 10

## 2017-10-25 MED ORDER — LIDOCAINE 2% (20 MG/ML) 5 ML SYRINGE
INTRAMUSCULAR | Status: DC | PRN
  Administered 2017-10-25: 60 mg via INTRAVENOUS

## 2017-10-25 MED ORDER — CEFAZOLIN SODIUM-DEXTROSE 2-4 GM/100ML-% IV SOLN
2.0000 g | Freq: Three times a day (TID) | INTRAVENOUS | Status: AC
  Administered 2017-10-25 – 2017-10-26 (×3): 2 g via INTRAVENOUS
  Filled 2017-10-25 (×3): qty 100

## 2017-10-25 MED ORDER — DEXAMETHASONE SODIUM PHOSPHATE 10 MG/ML IJ SOLN
INTRAMUSCULAR | Status: AC
  Filled 2017-10-25: qty 1

## 2017-10-25 MED ORDER — MIDAZOLAM HCL 2 MG/2ML IJ SOLN
INTRAMUSCULAR | Status: AC
  Filled 2017-10-25: qty 2

## 2017-10-25 MED ORDER — FENTANYL CITRATE (PF) 250 MCG/5ML IJ SOLN
INTRAMUSCULAR | Status: AC
  Filled 2017-10-25: qty 5

## 2017-10-25 MED ORDER — MIDAZOLAM HCL 2 MG/2ML IJ SOLN
INTRAMUSCULAR | Status: DC | PRN
  Administered 2017-10-25: 2 mg via INTRAVENOUS

## 2017-10-25 MED ORDER — PROPOFOL 10 MG/ML IV BOLUS
INTRAVENOUS | Status: AC
  Filled 2017-10-25: qty 20

## 2017-10-25 MED ORDER — SODIUM CHLORIDE 0.9 % IV SOLN
INTRAVENOUS | Status: DC | PRN
  Administered 2017-10-25 – 2017-10-26 (×2): via INTRAVENOUS

## 2017-10-25 MED ORDER — FENTANYL CITRATE (PF) 100 MCG/2ML IJ SOLN
100.0000 ug | Freq: Once | INTRAMUSCULAR | Status: AC
Start: 2017-10-25 — End: 2017-10-25
  Administered 2017-10-25: 100 ug via INTRAVENOUS
  Filled 2017-10-25: qty 2

## 2017-10-25 MED ORDER — ROCURONIUM BROMIDE 10 MG/ML (PF) SYRINGE
PREFILLED_SYRINGE | INTRAVENOUS | Status: AC
  Filled 2017-10-25: qty 10

## 2017-10-25 MED ORDER — CHLORHEXIDINE GLUCONATE 0.12 % MT SOLN
15.0000 mL | Freq: Three times a day (TID) | OROMUCOSAL | Status: DC
  Administered 2017-10-25 – 2017-10-30 (×13): 15 mL via OROMUCOSAL
  Filled 2017-10-25 (×14): qty 15

## 2017-10-25 MED ORDER — ROCURONIUM BROMIDE 10 MG/ML (PF) SYRINGE
PREFILLED_SYRINGE | INTRAVENOUS | Status: DC | PRN
  Administered 2017-10-25 (×2): 40 mg via INTRAVENOUS

## 2017-10-25 MED ORDER — ONDANSETRON HCL 4 MG/2ML IJ SOLN
INTRAMUSCULAR | Status: DC | PRN
  Administered 2017-10-25: 4 mg via INTRAVENOUS

## 2017-10-25 MED ORDER — PROPOFOL 10 MG/ML IV BOLUS
INTRAVENOUS | Status: DC | PRN
  Administered 2017-10-25: 140 mg via INTRAVENOUS
  Administered 2017-10-25: 60 mg via INTRAVENOUS

## 2017-10-25 MED ORDER — LABETALOL HCL 5 MG/ML IV SOLN
10.0000 mg | Freq: Once | INTRAVENOUS | Status: AC
  Administered 2017-10-25: 10 mg via INTRAVENOUS

## 2017-10-25 MED ORDER — LIDOCAINE-EPINEPHRINE 1 %-1:100000 IJ SOLN
INTRAMUSCULAR | Status: DC | PRN
  Administered 2017-10-25: 10 mL

## 2017-10-25 MED ORDER — HYDROCODONE-ACETAMINOPHEN 7.5-325 MG PO TABS: 1.0000 | ORAL_TABLET | Freq: Once | ORAL | Status: DC | PRN

## 2017-10-25 MED ORDER — LACTATED RINGERS IV SOLN
INTRAVENOUS | Status: DC | PRN
  Administered 2017-10-25 (×2): via INTRAVENOUS

## 2017-10-25 MED ORDER — HYDROMORPHONE HCL 1 MG/ML IJ SOLN
INTRAMUSCULAR | Status: AC
  Administered 2017-10-25: 0.5 mg via INTRAVENOUS
  Filled 2017-10-25: qty 1

## 2017-10-25 MED ORDER — SUCCINYLCHOLINE CHLORIDE 200 MG/10ML IV SOSY
PREFILLED_SYRINGE | INTRAVENOUS | Status: DC | PRN
  Administered 2017-10-25: 80 mg via INTRAVENOUS

## 2017-10-25 MED ORDER — CEFAZOLIN SODIUM-DEXTROSE 2-4 GM/100ML-% IV SOLN
2.0000 g | Freq: Once | INTRAVENOUS | Status: AC
  Administered 2017-10-25: 2 g via INTRAVENOUS

## 2017-10-25 MED ORDER — ACETAMINOPHEN 10 MG/ML IV SOLN
INTRAVENOUS | Status: AC
  Filled 2017-10-25: qty 100

## 2017-10-25 MED ORDER — SUGAMMADEX SODIUM 200 MG/2ML IV SOLN
INTRAVENOUS | Status: DC | PRN
  Administered 2017-10-25: 200 mg via INTRAVENOUS

## 2017-10-25 MED ORDER — TETANUS-DIPHTH-ACELL PERTUSSIS 5-2.5-18.5 LF-MCG/0.5 IM SUSP
0.5000 mL | Freq: Once | INTRAMUSCULAR | Status: AC
  Administered 2017-10-25: 0.5 mL via INTRAMUSCULAR
  Filled 2017-10-25: qty 0.5

## 2017-10-25 MED ORDER — ONDANSETRON 4 MG PO TBDP: 4.0000 mg | ORAL_TABLET | Freq: Four times a day (QID) | ORAL | Status: DC | PRN

## 2017-10-25 MED ORDER — ONDANSETRON HCL 4 MG/2ML IJ SOLN
4.0000 mg | Freq: Once | INTRAMUSCULAR | Status: AC
  Administered 2017-10-25: 4 mg via INTRAVENOUS
  Filled 2017-10-25: qty 2

## 2017-10-25 MED ORDER — DIPHENHYDRAMINE HCL 50 MG/ML IJ SOLN
INTRAMUSCULAR | Status: AC
  Filled 2017-10-25: qty 1

## 2017-10-25 MED ORDER — ACETAMINOPHEN 10 MG/ML IV SOLN
INTRAVENOUS | Status: DC | PRN
  Administered 2017-10-25: 1000 mg via INTRAVENOUS

## 2017-10-25 MED ORDER — 0.9 % SODIUM CHLORIDE (POUR BTL) OPTIME
TOPICAL | Status: DC | PRN
  Administered 2017-10-25 (×2): 1000 mL

## 2017-10-25 MED ORDER — HYDROMORPHONE HCL 1 MG/ML IJ SOLN
0.5000 mg | INTRAMUSCULAR | Status: DC | PRN
  Administered 2017-10-26 – 2017-10-27 (×6): 1 mg via INTRAVENOUS
  Filled 2017-10-25 (×7): qty 1

## 2017-10-25 MED ORDER — OXYMETAZOLINE HCL 0.05 % NA SOLN
NASAL | Status: AC
  Filled 2017-10-25: qty 15

## 2017-10-25 MED ORDER — ONDANSETRON HCL 4 MG/2ML IJ SOLN
4.0000 mg | Freq: Four times a day (QID) | INTRAMUSCULAR | Status: DC | PRN
  Administered 2017-10-28 – 2017-10-30 (×2): 4 mg via INTRAVENOUS
  Filled 2017-10-25 (×2): qty 2

## 2017-10-25 MED ORDER — CLINDAMYCIN PHOSPHATE 600 MG/50ML IV SOLN
600.0000 mg | Freq: Once | INTRAVENOUS | Status: AC
  Administered 2017-10-25: 600 mg via INTRAVENOUS
  Filled 2017-10-25: qty 50

## 2017-10-25 MED ORDER — FENTANYL CITRATE (PF) 250 MCG/5ML IJ SOLN
INTRAMUSCULAR | Status: DC | PRN
  Administered 2017-10-25: 25 ug via INTRAVENOUS
  Administered 2017-10-25 (×2): 50 ug via INTRAVENOUS
  Administered 2017-10-25: 100 ug via INTRAVENOUS
  Administered 2017-10-25 (×3): 50 ug via INTRAVENOUS

## 2017-10-25 MED ORDER — LIDOCAINE-EPINEPHRINE 1 %-1:100000 IJ SOLN
INTRAMUSCULAR | Status: AC
  Filled 2017-10-25: qty 1

## 2017-10-25 MED ORDER — SODIUM CHLORIDE 0.9 % IV SOLN
INTRAVENOUS | Status: DC
  Administered 2017-10-25: 08:00:00 via INTRAVENOUS

## 2017-10-25 MED ORDER — FAMOTIDINE IN NACL 20-0.9 MG/50ML-% IV SOLN
20.0000 mg | Freq: Two times a day (BID) | INTRAVENOUS | Status: DC
  Administered 2017-10-25 – 2017-10-26 (×4): 20 mg via INTRAVENOUS
  Filled 2017-10-25 (×4): qty 50

## 2017-10-25 MED ORDER — PHENYLEPHRINE 40 MCG/ML (10ML) SYRINGE FOR IV PUSH (FOR BLOOD PRESSURE SUPPORT)
PREFILLED_SYRINGE | INTRAVENOUS | Status: AC
  Filled 2017-10-25: qty 10

## 2017-10-25 MED ORDER — ONDANSETRON HCL 4 MG/2ML IJ SOLN
INTRAMUSCULAR | Status: AC
  Filled 2017-10-25: qty 2

## 2017-10-25 MED ORDER — CEFAZOLIN SODIUM-DEXTROSE 2-4 GM/100ML-% IV SOLN
INTRAVENOUS | Status: AC
  Filled 2017-10-25: qty 100

## 2017-10-25 MED ORDER — BUPIVACAINE-EPINEPHRINE (PF) 0.5% -1:200000 IJ SOLN
INTRAMUSCULAR | Status: AC
Start: 2017-10-25 — End: ?
  Filled 2017-10-25: qty 30

## 2017-10-25 MED ORDER — LABETALOL HCL 5 MG/ML IV SOLN
INTRAVENOUS | Status: AC
  Filled 2017-10-25: qty 4

## 2017-10-25 MED ORDER — PHENYLEPHRINE HCL 10 MG/ML IJ SOLN
INTRAMUSCULAR | Status: DC | PRN
  Administered 2017-10-25: 25 ug/min via INTRAVENOUS

## 2017-10-25 MED ORDER — FENTANYL CITRATE (PF) 100 MCG/2ML IJ SOLN
50.0000 ug | Freq: Once | INTRAMUSCULAR | Status: AC
  Administered 2017-10-25: 50 ug via INTRAVENOUS
  Filled 2017-10-25: qty 2

## 2017-10-25 MED ORDER — HYDROMORPHONE HCL 1 MG/ML IJ SOLN
0.2500 mg | INTRAMUSCULAR | Status: DC | PRN
  Administered 2017-10-25: 0.5 mg via INTRAVENOUS

## 2017-10-25 MED ORDER — LACTATED RINGERS IV SOLN
INTRAVENOUS | Status: DC
  Administered 2017-10-27: 12:00:00 via INTRAVENOUS

## 2017-10-25 MED ORDER — BUPIVACAINE-EPINEPHRINE 0.25% -1:200000 IJ SOLN
INTRAMUSCULAR | Status: DC | PRN
  Administered 2017-10-25: 10 mL

## 2017-10-25 MED ORDER — PHENYLEPHRINE HCL 10 MG/ML IJ SOLN
INTRAMUSCULAR | Status: DC | PRN
  Administered 2017-10-25: 80 ug via INTRAVENOUS

## 2017-10-25 MED ORDER — DEXAMETHASONE SODIUM PHOSPHATE 10 MG/ML IJ SOLN
INTRAMUSCULAR | Status: DC | PRN
  Administered 2017-10-25: 10 mg via INTRAVENOUS

## 2017-10-25 MED ORDER — LIDOCAINE 2% (20 MG/ML) 5 ML SYRINGE
INTRAMUSCULAR | Status: AC
  Filled 2017-10-25: qty 5

## 2017-10-25 MED ORDER — POTASSIUM CHLORIDE IN NACL 20-0.9 MEQ/L-% IV SOLN
INTRAVENOUS | Status: DC
  Administered 2017-10-25 – 2017-10-26 (×3): via INTRAVENOUS
  Filled 2017-10-25 (×3): qty 1000

## 2017-10-25 MED ORDER — SODIUM CHLORIDE 0.9 % IV BOLUS
1000.0000 mL | Freq: Once | INTRAVENOUS | Status: AC
  Administered 2017-10-25: 1000 mL via INTRAVENOUS

## 2017-10-25 MED ORDER — DIPHENHYDRAMINE HCL 50 MG/ML IJ SOLN
INTRAMUSCULAR | Status: DC | PRN
  Administered 2017-10-25: 12.5 mg via INTRAVENOUS

## 2017-10-25 MED ORDER — ONDANSETRON HCL 4 MG/2ML IJ SOLN
4.0000 mg | Freq: Once | INTRAMUSCULAR | Status: AC
Start: 2017-10-25 — End: 2017-10-25
  Administered 2017-10-25: 4 mg via INTRAVENOUS
  Filled 2017-10-25: qty 2

## 2017-10-25 MED ORDER — LIDOCAINE HCL (PF) 1 % IJ SOLN
INTRAMUSCULAR | Status: AC
  Filled 2017-10-25: qty 5

## 2017-10-25 MED ORDER — SUGAMMADEX SODIUM 200 MG/2ML IV SOLN
INTRAVENOUS | Status: AC
  Filled 2017-10-25: qty 2

## 2017-10-25 MED ORDER — IOHEXOL 300 MG/ML  SOLN
100.0000 mL | Freq: Once | INTRAMUSCULAR | Status: AC | PRN
  Administered 2017-10-25: 100 mL via INTRAVENOUS

## 2017-10-25 SURGICAL SUPPLY — 108 items
BIT DRILL 4.9 CANNULATED (BIT) ×2
BIT DRILL CANN QC 4.9 LRG (BIT) IMPLANT
BIT DRILL STEP 3.5 (DRILL) IMPLANT
BLADE CLIPPER SURG (BLADE) IMPLANT
BLADE SURG 15 STRL LF DISP TIS (BLADE) IMPLANT
BLADE SURG 15 STRL SS (BLADE)
BRUSH SCRUB SURG 4.25 DISP (MISCELLANEOUS) ×8 IMPLANT
CANISTER SUCT 3000ML PPV (MISCELLANEOUS) ×4 IMPLANT
CLEANER TIP ELECTROSURG 2X2 (MISCELLANEOUS) ×2 IMPLANT
CLOSURE WOUND 1/2 X4 (GAUZE/BANDAGES/DRESSINGS)
COVER SURGICAL LIGHT HANDLE (MISCELLANEOUS) ×6 IMPLANT
DECANTER SPIKE VIAL GLASS SM (MISCELLANEOUS) ×4 IMPLANT
DRAPE C-ARM 42X72 X-RAY (DRAPES) ×4 IMPLANT
DRAPE C-ARMOR (DRAPES) ×4 IMPLANT
DRAPE HALF SHEET 40X57 (DRAPES) IMPLANT
DRAPE INCISE IOBAN 66X45 STRL (DRAPES) ×4 IMPLANT
DRAPE INCISE IOBAN 85X60 (DRAPES) ×4 IMPLANT
DRAPE ORTHO SPLIT 77X108 STRL (DRAPES) ×12
DRAPE SURG ORHT 6 SPLT 77X108 (DRAPES) ×4 IMPLANT
DRAPE U-SHAPE 47X51 STRL (DRAPES) ×2 IMPLANT
DRILL BIT CANNULATED 4.9 (BIT) ×4
DRILL STEP 3.5 (DRILL)
DRSG MEPILEX BORDER 4X12 (GAUZE/BANDAGES/DRESSINGS) IMPLANT
DRSG MEPILEX BORDER 4X4 (GAUZE/BANDAGES/DRESSINGS) ×3 IMPLANT
DRSG MEPILEX BORDER 4X8 (GAUZE/BANDAGES/DRESSINGS) IMPLANT
ELECT BLADE 6.5 EXT (BLADE) ×2 IMPLANT
ELECT COATED BLADE 2.86 ST (ELECTRODE) ×2 IMPLANT
ELECT NDL TIP 2.8 STRL (NEEDLE) IMPLANT
ELECT NEEDLE TIP 2.8 STRL (NEEDLE) IMPLANT
ELECT REM PT RETURN 9FT ADLT (ELECTROSURGICAL) ×4
ELECTRODE REM PT RTRN 9FT ADLT (ELECTROSURGICAL) ×2 IMPLANT
GLOVE BIO SURGEON STRL SZ7.5 (GLOVE) ×1 IMPLANT
GLOVE BIO SURGEON STRL SZ8 (GLOVE) ×6 IMPLANT
GLOVE BIOGEL M 7.0 STRL (GLOVE) ×6 IMPLANT
GLOVE BIOGEL PI IND STRL 7.5 (GLOVE) ×2 IMPLANT
GLOVE BIOGEL PI IND STRL 8 (GLOVE) ×2 IMPLANT
GLOVE BIOGEL PI INDICATOR 7.5 (GLOVE)
GLOVE BIOGEL PI INDICATOR 8 (GLOVE) ×2
GOWN STRL REUS W/ TWL LRG LVL3 (GOWN DISPOSABLE) ×8 IMPLANT
GOWN STRL REUS W/ TWL XL LVL3 (GOWN DISPOSABLE) ×4 IMPLANT
GOWN STRL REUS W/TWL LRG LVL3 (GOWN DISPOSABLE) ×16
GOWN STRL REUS W/TWL XL LVL3 (GOWN DISPOSABLE) ×8
GUIDEWIRE ASNIS 3.2 NONCAL (WIRE) ×2 IMPLANT
HANDPIECE INTERPULSE COAX TIP (DISPOSABLE)
KIT BASIN OR (CUSTOM PROCEDURE TRAY) ×8 IMPLANT
KIT TURNOVER KIT B (KITS) ×6 IMPLANT
MANIFOLD NEPTUNE II (INSTRUMENTS) ×2 IMPLANT
NDL BLUNT 18X1 FOR OR ONLY (NEEDLE) ×2 IMPLANT
NDL HYPO 25GX1X1/2 BEV (NEEDLE) IMPLANT
NEEDLE BLUNT 18X1 FOR OR ONLY (NEEDLE) IMPLANT
NEEDLE HYPO 25GX1X1/2 BEV (NEEDLE) ×8 IMPLANT
NS IRRIG 1000ML POUR BTL (IV SOLUTION) ×8 IMPLANT
PACK TOTAL JOINT (CUSTOM PROCEDURE TRAY) ×2 IMPLANT
PAD ARMBOARD 7.5X6 YLW CONV (MISCELLANEOUS) ×14 IMPLANT
PATTIES SURGICAL .5 X3 (DISPOSABLE) IMPLANT
PENCIL BUTTON HOLSTER BLD 10FT (ELECTRODE) ×2 IMPLANT
PLATE HYBRID GOLD MMF (Plate) ×2 IMPLANT
PLATE HYBRID MMF GOLD (Plate) ×1 IMPLANT
PLATE HYBRID MMF SM (Plate) ×2 IMPLANT
RETRIEVER SUT HEWSON (MISCELLANEOUS) ×1 IMPLANT
SCISSORS WIRE DISP (INSTRUMENTS) ×4 IMPLANT
SCREW CANNULATED 8.0X145MM (Screw) ×2 IMPLANT
SCREW LOCK SELFDRIL 2.0X8M MMF (Screw) ×32 IMPLANT
SCREW UPPER FACE 2.0X12MM (Screw) ×2 IMPLANT
SCREW UPPER FACE 2.0X8MM (Screw) ×2 IMPLANT
SET HNDPC FAN SPRY TIP SCT (DISPOSABLE) ×2 IMPLANT
SPONGE LAP 18X18 X RAY DECT (DISPOSABLE) ×2 IMPLANT
STAPLER VISISTAT 35W (STAPLE) ×6 IMPLANT
STRIP CLOSURE SKIN 1/2X4 (GAUZE/BANDAGES/DRESSINGS) ×2 IMPLANT
SUCTION FRAZIER HANDLE 10FR (MISCELLANEOUS) ×2
SUCTION FRAZIER TIP 10 FR DISP (SUCTIONS) IMPLANT
SUCTION FRAZIER TIP 8 FR DISP (SUCTIONS)
SUCTION TUBE FRAZIER 10FR DISP (MISCELLANEOUS) ×2 IMPLANT
SUCTION TUBE FRAZIER 8FR DISP (SUCTIONS) IMPLANT
SUT BONE WAX W31G (SUTURE) ×3 IMPLANT
SUT CHROMIC 3 0 SH 27 (SUTURE) ×12 IMPLANT
SUT ETHILON 2 0 PSLX (SUTURE) ×4 IMPLANT
SUT ETHILON 3 0 PS 1 (SUTURE) ×1 IMPLANT
SUT FIBERWIRE #2 38 T-5 BLUE (SUTURE)
SUT SILK 3 0 (SUTURE)
SUT SILK 3 0 SH 30 (SUTURE) ×1 IMPLANT
SUT SILK 3-0 18XBRD TIE 12 (SUTURE) IMPLANT
SUT STEEL 0 (SUTURE)
SUT STEEL 0 18XMFL TIE 17 (SUTURE) IMPLANT
SUT STEEL 2 (SUTURE) ×4 IMPLANT
SUT VIC AB 0 CT1 27 (SUTURE)
SUT VIC AB 0 CT1 27XBRD ANBCTR (SUTURE) ×2 IMPLANT
SUT VIC AB 1 CT1 18XCR BRD 8 (SUTURE) ×2 IMPLANT
SUT VIC AB 1 CT1 27 (SUTURE)
SUT VIC AB 1 CT1 27XBRD ANBCTR (SUTURE) ×2 IMPLANT
SUT VIC AB 1 CT1 8-18 (SUTURE)
SUT VIC AB 2-0 CT1 27 (SUTURE)
SUT VIC AB 2-0 CT1 TAPERPNT 27 (SUTURE) ×2 IMPLANT
SUT VIC AB 3-0 FS2 27 (SUTURE) IMPLANT
SUT VIC AB 4-0 P-3 18X BRD (SUTURE) IMPLANT
SUT VIC AB 4-0 P3 18 (SUTURE)
SUT VIC AB 5-0 P-3 18XBRD (SUTURE) IMPLANT
SUT VIC AB 5-0 P3 18 (SUTURE)
SUTURE FIBERWR #2 38 T-5 BLUE (SUTURE) ×4 IMPLANT
SYR 50ML LL SCALE MARK (SYRINGE) ×2 IMPLANT
SYR CONTROL 10ML LL (SYRINGE) ×4 IMPLANT
TOWEL OR 17X24 6PK STRL BLUE (TOWEL DISPOSABLE) ×4 IMPLANT
TOWEL OR 17X26 10 PK STRL BLUE (TOWEL DISPOSABLE) ×8 IMPLANT
TRAY ENT MC OR (CUSTOM PROCEDURE TRAY) ×4 IMPLANT
TRAY FOLEY MTR SLVR 16FR STAT (SET/KITS/TRAYS/PACK) ×3 IMPLANT
WASHER SCREW MATTA SS 13.0X1.5 (Washer) ×2 IMPLANT
WATER STERILE IRR 1000ML POUR (IV SOLUTION) ×2 IMPLANT
YANKAUER SUCT BULB TIP NO VENT (SUCTIONS) ×2 IMPLANT

## 2017-10-25 NOTE — ED Notes (Signed)
Dr Drab at bedside 

## 2017-10-25 NOTE — ED Notes (Signed)
MD Wyatt aware of pt HR

## 2017-10-25 NOTE — Consult Note (Signed)
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 discussed with the patient the risks and benefits of surgery for sacral stabilization using a transsacral screw placed from left to right, including the possibility of infection, nerve injury, vessel injury, wound breakdown, arthritis, symptomatic hardware, DVT/ PE, loss of motion, malunion, nonunion, and need for further surgery among others.  We also specifically discussed the elevated risk of foot drop that could affect either side but more likely on the left given comminution.  He acknowledged these risks and wished to proceed.  I then called and spoke with both Dr. Bethann Goo and the OR, coordinating his care. We have posted him for today with surgery possible after 1:30pm based on current schedule, with plans for the ortho procedure to go first given anticipated lengths of each.   NPO.  Weightbearing: NWB on LLE LLE Insicional and dressing care: OK to remove dressings in 2 days post op and leave open to air Orthopedic device(s): crutches or walker Showering: yes VTE prophylaxis: Lovenox '40mg'$  qd 20 days+ Pain  control: Percs or Norco Follow - up plan: 2 weeks Contact information:  Altamese Cowgill MD, Ainsley Spinner, PA-C   Altamese Flagler, MD [facility] [phone]  [phone] (O) 10/25/2017, 9:56 AM

## 2017-10-25 NOTE — Anesthesia Postprocedure Evaluation (Signed)
Anesthesia Post Note  Patient: Richard Valentine  Procedure(s) Performed: LEFT AND RIGHT SACRO-ILIAC SCREW FIXATION (Left Pelvis) CLOSED REDUCTION OF RIGHT SUBCONDYLAR FRACTURE WITH MMF,CLOSED REDUCTIONLEFT PARASYMPHASIS FRACTURE WITH MMF,SURGICAL EXTRACTION OF TEETH #26 AND #30, PLACEMENT OF MAXILLARY/ MANDIBULAR HYBRID ARCH BARS (Bilateral Face)     Patient location during evaluation: PACU Anesthesia Type: General Level of consciousness: awake and alert Pain management: pain level controlled Vital Signs Assessment: post-procedure vital signs reviewed and stable Respiratory status: spontaneous breathing, nonlabored ventilation, respiratory function stable and patient connected to nasal cannula oxygen Cardiovascular status: blood pressure returned to baseline and stable Postop Assessment: no apparent nausea or vomiting Anesthetic complications: no    Last Vitals:  Vitals:   10/25/17 2000 10/25/17 2100  BP: (!) 162/99 (!) 163/94  Pulse: 84 86  Resp: 15 15  Temp:    SpO2: 99% 98%    Last Pain:  Vitals:   10/25/17 1900  TempSrc: Axillary  PainSc: 0-No pain                 Virgel Haro

## 2017-10-25 NOTE — ED Notes (Signed)
Dr. Carola FrostHandy paged to [phone]-per Dr. Doloris HallPollina-paged by Marylene LandAngela

## 2017-10-25 NOTE — ED Notes (Signed)
Ortho MD at bedside.

## 2017-10-25 NOTE — OR Nursing (Signed)
Throat pack in at 1607. Throat pack out at 1703.

## 2017-10-25 NOTE — ED Notes (Signed)
Dr. Kenney Housemanrab (ENT-Trauma/Mandible) paged  @ [phone]-per Dr. Doloris HallPollina-paged by Marylene LandAngela

## 2017-10-25 NOTE — ED Notes (Signed)
Dr.Wyatt at the bedside 
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
## 2017-10-25 NOTE — Anesthesia Procedure Notes (Signed)
Procedure Name: Intubation Date/Time: 10/25/2017 2:55 PM Performed by: Dairl PonderJiang, Anaiya Wisinski, CRNA Pre-anesthesia Checklist: Patient identified, Emergency Drugs available, Suction available, Patient being monitored and Timeout performed Patient Re-evaluated:Patient Re-evaluated prior to induction Oxygen Delivery Method: Circle system utilized Preoxygenation: Pre-oxygenation with 100% oxygen Induction Type: IV induction, Cricoid Pressure applied and Rapid sequence Laryngoscope Size: Glidescope and 4 Grade View: Grade I Nasal Tubes: Right, Nasal prep performed and Nasal Rae Tube size: 7.0 mm Number of attempts: 1 Placement Confirmation: ETT inserted through vocal cords under direct vision,  positive ETCO2 and breath sounds checked- equal and bilateral Tube secured with: Tape Dental Injury: Teeth and Oropharynx as per pre-operative assessment
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
## 2017-10-25 NOTE — OR Nursing (Signed)
Patient had personal effects within his shorts pocket consisting of  four (1) dollar bills, one opened pack of gum, one dime and one small apple pocket device. These items  were labeled and taken to his wife by Marry GuanMelissa Arrington RN.

## 2017-10-25 NOTE — Transfer of Care (Signed)
Immediate Anesthesia Transfer of Care Note  Patient: Richard Valentine  Procedure(s) Performed: LEFT AND RIGHT SACRO-ILIAC SCREW FIXATION (Left Pelvis) CLOSED REDUCTION OF RIGHT SUBCONDYLAR FRACTURE WITH MMF,CLOSED REDUCTIONLEFT PARASYMPHASIS FRACTURE WITH MMF,SURGICAL EXTRACTION OF TEETH #26 AND #30, PLACEMENT OF MAXILLARY/ MANDIBULAR HYBRID ARCH BARS (Bilateral Face)  Patient Location: PACU  Anesthesia Type:General  Level of Consciousness: awake  Airway & Oxygen Therapy: Patient Spontanous Breathing and Patient connected to nasal cannula oxygen  Post-op Assessment: Report given to RN and Post -op Vital signs reviewed and stable  Post vital signs: Reviewed and stable  Last Vitals:  Vitals Value Taken Time  BP 187/92 10/25/2017  6:02 PM  Temp    Pulse 103 10/25/2017  6:15 PM  Resp 17 10/25/2017  6:15 PM  SpO2 99 % 10/25/2017  6:15 PM  Vitals shown include unvalidated device data.  Last Pain:  Vitals:   10/25/17 1800  PainSc: 0-No pain         Complications: No apparent anesthesia complications
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
## 2017-10-25 NOTE — Anesthesia Preprocedure Evaluation (Signed)
Anesthesia Evaluation  Patient identified by MRN, date of birth, ID band Patient awake    Reviewed: Allergy & Precautions, NPO status , Patient's Chart, lab work & pertinent test results  History of Anesthesia Complications Negative for: history of anesthetic complications  Airway Mallampati: III   Neck ROM: Full  Mouth opening: Limited Mouth Opening  Dental  (+) Teeth Intact, Dental Advisory Given   Pulmonary neg pulmonary ROS,    breath sounds clear to auscultation       Cardiovascular hypertension, Pt. on medications  Rhythm:Regular     Neuro/Psych negative neurological ROS  negative psych ROS   GI/Hepatic negative GI ROS, Neg liver ROS,   Endo/Other  negative endocrine ROS  Renal/GU negative Renal ROS     Musculoskeletal   Abdominal   Peds  Hematology negative hematology ROS (+)   Anesthesia Other Findings   Reproductive/Obstetrics                             Anesthesia Physical Anesthesia Plan  ASA: II  Anesthesia Plan: General   Post-op Pain Management:    Induction: Intravenous, Rapid sequence and Cricoid pressure planned  PONV Risk Score and Plan: 2 and Ondansetron and Dexamethasone  Airway Management Planned: Nasal ETT  Additional Equipment:   Intra-op Plan:   Post-operative Plan: Extubation in OR  Informed Consent: I have reviewed the patients History and Physical, chart, labs and discussed the procedure including the risks, benefits and alternatives for the proposed anesthesia with the patient or authorized representative who has indicated his/her understanding and acceptance.   Dental advisory given  Plan Discussed with: CRNA and Surgeon  Anesthesia Plan Comments:         Anesthesia Quick Evaluation

## 2017-10-25 NOTE — ED Notes (Signed)
Dr Wyatt at bedside.  

## 2017-10-25 NOTE — H&P (Signed)
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 fractures without contusion; Open book pelvic fracture without hemodynamic instability--ortho to see; 3.  Facial lacerations   Admit to trauma IV antibiotics for open facial fractures; Will likely need pelvic stabilization.  Will try to coordinate surgery between [facility] and Orthopedic surgery.   Judeth Horn 10/25/2017, 8:26 AM   Procedures
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
## 2017-10-25 NOTE — ED Notes (Signed)
After repeated counseling; patient removed his C-Collar regardless of advice.
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
## 2017-10-26 ENCOUNTER — Inpatient Hospital Stay (HOSPITAL_COMMUNITY): Payer: Self-pay

## 2017-10-26 LAB — BASIC METABOLIC PANEL
Anion gap: 12 (ref 5–15)
BUN: 10 mg/dL (ref 6–20)
CALCIUM: 8 mg/dL — AB (ref 8.9–10.3)
CO2: 26 mmol/L (ref 22–32)
CREATININE: 1.45 mg/dL — AB (ref 0.61–1.24)
Chloride: 101 mmol/L (ref 98–111)
GFR calc Af Amer: >60 mL/min (ref >60)
GFR, EST NON AFRICAN AMERICAN: 59 mL/min — AB (ref >60)
GLUCOSE: 143 mg/dL — AB (ref 70–99)
Potassium: 4.8 mmol/L (ref 3.5–5.1)
SODIUM: 139 mmol/L (ref 135–145)

## 2017-10-26 LAB — CBC
HCT: 40.3 % (ref 39.0–52.0)
Hemoglobin: 13 g/dL (ref 13.0–17.0)
MCH: 28.6 pg (ref 26.0–34.0)
MCHC: 32.3 g/dL (ref 30.0–36.0)
MCV: 88.8 fL (ref 78.0–100.0)
Platelets: 218 10*3/uL (ref 150–400)
RBC: 4.54 MIL/uL (ref 4.22–5.81)
RDW: 12.7 % (ref 11.5–15.5)
WBC: 10.6 10*3/uL — ABNORMAL HIGH (ref 4.0–10.5)

## 2017-10-26 MED ORDER — HYDRALAZINE HCL 20 MG/ML IJ SOLN
10.0000 mg | Freq: Four times a day (QID) | INTRAMUSCULAR | Status: DC | PRN
  Administered 2017-10-26 – 2017-10-28 (×2): 10 mg via INTRAVENOUS
  Filled 2017-10-26 (×2): qty 1

## 2017-10-26 MED ORDER — LISINOPRIL 10 MG PO TABS
10.0000 mg | ORAL_TABLET | Freq: Every day | ORAL | Status: DC
  Administered 2017-10-26 – 2017-10-30 (×5): 10 mg via ORAL
  Filled 2017-10-26 (×5): qty 1

## 2017-10-26 MED ORDER — ACETAMINOPHEN 160 MG/5ML PO SOLN
650.0000 mg | Freq: Four times a day (QID) | ORAL | Status: DC
  Administered 2017-10-26 – 2017-10-29 (×11): 650 mg via ORAL
  Filled 2017-10-26 (×11): qty 20.3

## 2017-10-26 MED ORDER — LORAZEPAM 1 MG PO TABS
1.0000 mg | ORAL_TABLET | Freq: Four times a day (QID) | ORAL | Status: AC | PRN
  Administered 2017-10-28: 1 mg via ORAL
  Filled 2017-10-26: qty 1

## 2017-10-26 MED ORDER — DEXMEDETOMIDINE HCL IN NACL 400 MCG/100ML IV SOLN
0.4000 ug/kg/h | INTRAVENOUS | Status: DC
  Administered 2017-10-26: 0.4 ug/kg/h via INTRAVENOUS
  Filled 2017-10-26 (×2): qty 100

## 2017-10-26 MED ORDER — THIAMINE HCL 100 MG/ML IJ SOLN
100.0000 mg | Freq: Every day | INTRAMUSCULAR | Status: DC
  Administered 2017-10-26: 100 mg via INTRAVENOUS
  Filled 2017-10-26 (×2): qty 2

## 2017-10-26 MED ORDER — VITAMIN B-1 100 MG PO TABS
100.0000 mg | ORAL_TABLET | Freq: Every day | ORAL | Status: DC
  Administered 2017-10-27 – 2017-10-30 (×4): 100 mg via ORAL
  Filled 2017-10-26 (×4): qty 1

## 2017-10-26 MED ORDER — METOPROLOL TARTRATE 5 MG/5ML IV SOLN
10.0000 mg | Freq: Four times a day (QID) | INTRAVENOUS | Status: DC | PRN
  Administered 2017-10-26 – 2017-10-28 (×3): 10 mg via INTRAVENOUS
  Filled 2017-10-26 (×4): qty 10

## 2017-10-26 MED ORDER — LORAZEPAM 1 MG PO TABS
0.0000 mg | ORAL_TABLET | Freq: Two times a day (BID) | ORAL | Status: AC
  Administered 2017-10-29: 1 mg via ORAL
  Filled 2017-10-26: qty 1

## 2017-10-26 MED ORDER — ENOXAPARIN SODIUM 30 MG/0.3ML ~~LOC~~ SOLN
30.0000 mg | Freq: Two times a day (BID) | SUBCUTANEOUS | Status: DC
  Administered 2017-10-26 – 2017-10-30 (×9): 30 mg via SUBCUTANEOUS
  Filled 2017-10-26 (×9): qty 0.3

## 2017-10-26 MED ORDER — HYDROCHLOROTHIAZIDE 10 MG/ML ORAL SUSPENSION
25.0000 mg | Freq: Every day | ORAL | Status: DC
  Administered 2017-10-26 – 2017-10-29 (×4): 25 mg via ORAL
  Filled 2017-10-26 (×5): qty 2.5

## 2017-10-26 MED ORDER — LORAZEPAM 1 MG PO TABS: 0.0000 mg | ORAL_TABLET | Freq: Four times a day (QID) | ORAL | Status: AC

## 2017-10-26 MED ORDER — PNEUMOCOCCAL VAC POLYVALENT 25 MCG/0.5ML IJ INJ
0.5000 mL | INJECTION | INTRAMUSCULAR | Status: AC
  Administered 2017-10-27: 0.5 mL via INTRAMUSCULAR
  Filled 2017-10-26: qty 0.5

## 2017-10-26 MED ORDER — ADULT MULTIVITAMIN W/MINERALS CH
1.0000 | ORAL_TABLET | Freq: Every day | ORAL | Status: DC
  Administered 2017-10-26 – 2017-10-30 (×5): 1 via ORAL
  Filled 2017-10-26 (×5): qty 1

## 2017-10-26 MED ORDER — FOLIC ACID 1 MG PO TABS
1.0000 mg | ORAL_TABLET | Freq: Every day | ORAL | Status: DC
  Administered 2017-10-26 – 2017-10-30 (×5): 1 mg via ORAL
  Filled 2017-10-26 (×5): qty 1

## 2017-10-26 MED ORDER — LORAZEPAM 2 MG/ML IJ SOLN
1.0000 mg | Freq: Four times a day (QID) | INTRAMUSCULAR | Status: AC | PRN
  Administered 2017-10-26: 1 mg via INTRAVENOUS
  Filled 2017-10-26: qty 1
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
## 2017-10-27 ENCOUNTER — Encounter (HOSPITAL_COMMUNITY): Payer: Self-pay

## 2017-10-27 HISTORY — PX: OTHER SURGICAL HISTORY: SHX169

## 2017-10-27 LAB — CBC
HEMATOCRIT: 37.9 % — AB (ref 39.0–52.0)
HEMOGLOBIN: 12 g/dL — AB (ref 13.0–17.0)
MCH: 28.7 pg (ref 26.0–34.0)
MCHC: 31.7 g/dL (ref 30.0–36.0)
MCV: 90.7 fL (ref 78.0–100.0)
Platelets: 188 10*3/uL (ref 150–400)
RBC: 4.18 MIL/uL — ABNORMAL LOW (ref 4.22–5.81)
RDW: 12.8 % (ref 11.5–15.5)
WBC: 11.5 10*3/uL — ABNORMAL HIGH (ref 4.0–10.5)

## 2017-10-27 MED ORDER — KETOROLAC TROMETHAMINE 30 MG/ML IJ SOLN
30.0000 mg | Freq: Once | INTRAMUSCULAR | Status: AC
  Administered 2017-10-27: 30 mg via INTRAVENOUS
  Filled 2017-10-27: qty 1

## 2017-10-27 MED ORDER — TRAMADOL HCL 50 MG PO TABS
50.0000 mg | ORAL_TABLET | Freq: Four times a day (QID) | ORAL | Status: DC
  Administered 2017-10-27 – 2017-10-29 (×7): 50 mg via ORAL
  Filled 2017-10-27 (×8): qty 1

## 2017-10-27 MED ORDER — HYDROMORPHONE HCL 1 MG/ML IJ SOLN
0.5000 mg | INTRAMUSCULAR | Status: DC | PRN
  Administered 2017-10-27 – 2017-10-30 (×11): 1 mg via INTRAVENOUS
  Filled 2017-10-27 (×11): qty 1

## 2017-10-27 MED ORDER — ENSURE ENLIVE PO LIQD
237.0000 mL | Freq: Three times a day (TID) | ORAL | Status: DC
  Administered 2017-10-27 – 2017-10-30 (×7): 237 mL via ORAL

## 2017-10-27 MED ORDER — TRAMADOL 5 MG/ML ORAL SUSPENSION
50.0000 mg | Freq: Four times a day (QID) | ORAL | Status: DC
Start: 2017-10-27 — End: 2017-10-27

## 2017-10-27 MED ORDER — FAMOTIDINE 40 MG/5ML PO SUSR
20.0000 mg | Freq: Every day | ORAL | Status: DC
  Administered 2017-10-27 – 2017-10-30 (×4): 20 mg via ORAL
  Filled 2017-10-27 (×4): qty 2.5

## 2017-10-27 MED ORDER — ALUM & MAG HYDROXIDE-SIMETH 200-200-20 MG/5ML PO SUSP
30.0000 mL | Freq: Two times a day (BID) | ORAL | Status: DC | PRN
  Administered 2017-10-27: 30 mL via ORAL
  Filled 2017-10-27: qty 30

## 2017-10-27 MED ORDER — KETOROLAC TROMETHAMINE 15 MG/ML IJ SOLN
15.0000 mg | Freq: Four times a day (QID) | INTRAMUSCULAR | Status: AC
  Administered 2017-10-27 – 2017-10-29 (×6): 15 mg via INTRAVENOUS
  Filled 2017-10-27 (×7): qty 1
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
## 2017-10-27 NOTE — Progress Notes (Signed)
Nutrition Brief Note  Pt being transferred.  Full nutrition assessment to follow.  Add ensure enlive BID  Kendell BaneHeather Rodnisha Blomgren RD, LDN, CNSC [phone] Pager [phone] After Hours Pager
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
## 2017-10-27 NOTE — Progress Notes (Signed)
Spiritual Care consult acknowledged for Advanced Directive Education.  Pt. Declines at this time.  Chaplain is looking forward to returning for a Pastoral Visit.  Want to explore Mr. Richard Valentine's feelings and reflections about the events that led to him presenting in the hospital.  .

## 2017-10-28 ENCOUNTER — Encounter (HOSPITAL_COMMUNITY): Payer: Self-pay | Admitting: *Deleted

## 2017-10-28 DIAGNOSIS — I10 Essential (primary) hypertension: Secondary | ICD-10-CM

## 2017-10-28 DIAGNOSIS — G8918 Other acute postprocedural pain: Secondary | ICD-10-CM

## 2017-10-28 DIAGNOSIS — F1010 Alcohol abuse, uncomplicated: Secondary | ICD-10-CM

## 2017-10-28 DIAGNOSIS — D72829 Elevated white blood cell count, unspecified: Secondary | ICD-10-CM

## 2017-10-28 DIAGNOSIS — D62 Acute posthemorrhagic anemia: Secondary | ICD-10-CM

## 2017-10-28 DIAGNOSIS — R739 Hyperglycemia, unspecified: Secondary | ICD-10-CM

## 2017-10-28 DIAGNOSIS — S2241XA Multiple fractures of ribs, right side, initial encounter for closed fracture: Secondary | ICD-10-CM

## 2017-10-28 DIAGNOSIS — S3210XA Unspecified fracture of sacrum, initial encounter for closed fracture: Secondary | ICD-10-CM

## 2017-10-28 DIAGNOSIS — S02609A Fracture of mandible, unspecified, initial encounter for closed fracture: Secondary | ICD-10-CM

## 2017-10-28 DIAGNOSIS — T148XXA Other injury of unspecified body region, initial encounter: Secondary | ICD-10-CM

## 2017-10-28 DIAGNOSIS — T380X5A Adverse effect of glucocorticoids and synthetic analogues, initial encounter: Secondary | ICD-10-CM

## 2017-10-28 LAB — HIV ANTIBODY (ROUTINE TESTING W REFLEX): HIV SCREEN 4TH GENERATION: NONREACTIVE

## 2017-10-28 MED ORDER — OXYCODONE HCL 5 MG/5ML PO SOLN
5.0000 mg | ORAL | Status: DC | PRN
  Administered 2017-10-28: 7.5 mg via ORAL
  Filled 2017-10-28: qty 10

## 2017-10-28 MED ORDER — CLINDAMYCIN PHOSPHATE 600 MG/50ML IV SOLN
600.0000 mg | Freq: Three times a day (TID) | INTRAVENOUS | Status: DC
  Filled 2017-10-28 (×2): qty 50

## 2017-10-28 MED ORDER — POLYETHYLENE GLYCOL 3350 17 G PO PACK
17.0000 g | PACK | Freq: Every day | ORAL | Status: DC
  Administered 2017-10-29 – 2017-10-30 (×2): 17 g via ORAL
  Filled 2017-10-28 (×3): qty 1

## 2017-10-28 MED ORDER — SACCHAROMYCES BOULARDII 250 MG PO CAPS
250.0000 mg | ORAL_CAPSULE | Freq: Two times a day (BID) | ORAL | Status: DC
  Administered 2017-10-28 – 2017-10-30 (×5): 250 mg via ORAL
  Filled 2017-10-28 (×4): qty 1

## 2017-10-28 MED ORDER — CLINDAMYCIN PALMITATE HCL 75 MG/5ML PO SOLR
300.0000 mg | Freq: Three times a day (TID) | ORAL | Status: DC
  Administered 2017-10-28 – 2017-10-30 (×7): 300 mg via ORAL
  Filled 2017-10-28 (×9): qty 20
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
## 2017-10-28 NOTE — Progress Notes (Signed)
Inpatient Rehabilitation Admissions Coordinator  Patient is progressing well with therapy with better pain control. Transfers only. I will follow his progress.  Ottie GlazierBarbara Nafisah Runions, RN, MSN Rehab Admissions Coordinator [phone] 10/28/2017 4:17 PM
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
## 2017-10-29 ENCOUNTER — Inpatient Hospital Stay (HOSPITAL_COMMUNITY): Payer: Self-pay

## 2017-10-29 LAB — COMPREHENSIVE METABOLIC PANEL
ALBUMIN: 3.4 g/dL — AB (ref 3.5–5.0)
ALT: 36 U/L (ref 0–44)
AST: 39 U/L (ref 15–41)
Alkaline Phosphatase: 46 U/L (ref 38–126)
Anion gap: 11 (ref 5–15)
BILIRUBIN TOTAL: 1.3 mg/dL — AB (ref 0.3–1.2)
BUN: 18 mg/dL (ref 6–20)
CHLORIDE: 92 mmol/L — AB (ref 98–111)
CO2: 31 mmol/L (ref 22–32)
Calcium: 9.6 mg/dL (ref 8.9–10.3)
Creatinine, Ser: 1.43 mg/dL — ABNORMAL HIGH (ref 0.61–1.24)
GFR calc Af Amer: >60 mL/min (ref >60)
GLUCOSE: 107 mg/dL — AB (ref 70–99)
POTASSIUM: 3.8 mmol/L (ref 3.5–5.1)
Sodium: 134 mmol/L — ABNORMAL LOW (ref 135–145)
TOTAL PROTEIN: 6.9 g/dL (ref 6.5–8.1)

## 2017-10-29 LAB — CBC
HEMATOCRIT: 39.8 % (ref 39.0–52.0)
Hemoglobin: 13.4 g/dL (ref 13.0–17.0)
MCH: 29.3 pg (ref 26.0–34.0)
MCHC: 33.7 g/dL (ref 30.0–36.0)
MCV: 86.9 fL (ref 78.0–100.0)
PLATELETS: 224 10*3/uL (ref 150–400)
RBC: 4.58 MIL/uL (ref 4.22–5.81)
RDW: 11.8 % (ref 11.5–15.5)
WBC: 9.4 10*3/uL (ref 4.0–10.5)

## 2017-10-29 MED ORDER — TRAMADOL HCL 50 MG PO TABS
100.0000 mg | ORAL_TABLET | Freq: Four times a day (QID) | ORAL | Status: DC
  Administered 2017-10-29 – 2017-10-30 (×5): 100 mg via ORAL
  Filled 2017-10-29 (×5): qty 2

## 2017-10-29 MED ORDER — ACETAMINOPHEN 160 MG/5ML PO SOLN
1000.0000 mg | Freq: Four times a day (QID) | ORAL | Status: DC
  Administered 2017-10-29 – 2017-10-30 (×5): 1000 mg via ORAL
  Filled 2017-10-29 (×5): qty 40.6
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
## 2017-10-29 NOTE — Progress Notes (Signed)
Inpatient Rehabilitation Admissions Coordinator  I met with patient and wife at bedside. Appropriate for d/c home and they are in agreement. We will sign off at this time.  Danne Baxter, RN, MSN Rehab Admissions Coordinator [phone] 10/29/2017 4:15 PM
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
## 2017-10-30 MED ORDER — FAMOTIDINE 40 MG/5ML PO SUSR: 20.0000 mg | Freq: Every day | ORAL | 0 refills | Status: DC

## 2017-10-30 MED ORDER — HYDROMORPHONE HCL 1 MG/ML IJ SOLN
0.5000 mg | Freq: Two times a day (BID) | INTRAMUSCULAR | Status: DC | PRN
  Administered 2017-10-30: 1 mg via INTRAVENOUS
  Filled 2017-10-30 (×2): qty 1

## 2017-10-30 MED ORDER — DOCUSATE SODIUM 100 MG PO CAPS: 100.0000 mg | ORAL_CAPSULE | Freq: Two times a day (BID) | ORAL | 0 refills | Status: DC

## 2017-10-30 MED ORDER — HYDROCHLOROTHIAZIDE 25 MG PO TABS: 25.0000 mg | ORAL_TABLET | Freq: Every day | ORAL | 1 refills | Status: AC

## 2017-10-30 MED ORDER — TRAMADOL HCL 50 MG PO TABS: 100.0000 mg | ORAL_TABLET | Freq: Four times a day (QID) | ORAL | 0 refills | Status: DC | PRN

## 2017-10-30 MED ORDER — CLINDAMYCIN PALMITATE HCL 75 MG/5ML PO SOLR: 300.0000 mg | Freq: Three times a day (TID) | ORAL | 0 refills | Status: DC

## 2017-10-30 MED ORDER — ENOXAPARIN (LOVENOX) PATIENT EDUCATION KIT: 1.0000 | PACK | Freq: Once | 0 refills | Status: AC

## 2017-10-30 MED ORDER — CLINDAMYCIN PALMITATE HCL 75 MG/5ML PO SOLR: 300.0000 mg | Freq: Three times a day (TID) | ORAL | 0 refills | Status: AC

## 2017-10-30 MED ORDER — LISINOPRIL 10 MG PO TABS: 10.0000 mg | ORAL_TABLET | Freq: Every day | ORAL | 1 refills | Status: DC

## 2017-10-30 MED ORDER — HYDROCHLOROTHIAZIDE 25 MG PO TABS: 25.0000 mg | ORAL_TABLET | Freq: Every day | ORAL | 1 refills | Status: DC

## 2017-10-30 MED ORDER — SACCHAROMYCES BOULARDII 250 MG PO CAPS: 250.0000 mg | ORAL_CAPSULE | Freq: Two times a day (BID) | ORAL | Status: DC

## 2017-10-30 MED ORDER — ADULT MULTIVITAMIN W/MINERALS CH: 1.0000 | ORAL_TABLET | Freq: Every day | ORAL | Status: DC

## 2017-10-30 MED ORDER — DOCUSATE SODIUM 100 MG PO CAPS
100.0000 mg | ORAL_CAPSULE | Freq: Two times a day (BID) | ORAL | Status: DC
  Administered 2017-10-30: 100 mg via ORAL
  Filled 2017-10-30: qty 1

## 2017-10-30 MED ORDER — HYDROCHLOROTHIAZIDE 25 MG PO TABS
25.0000 mg | ORAL_TABLET | Freq: Every day | ORAL | Status: DC
  Administered 2017-10-30: 25 mg via ORAL
  Filled 2017-10-30 (×2): qty 1

## 2017-10-30 MED ORDER — LISINOPRIL 10 MG PO TABS: 10.0000 mg | ORAL_TABLET | Freq: Every day | ORAL | 1 refills | Status: AC

## 2017-10-30 MED ORDER — ENOXAPARIN SODIUM 40 MG/0.4ML ~~LOC~~ SOLN: 40.0000 mg | SUBCUTANEOUS | 0 refills | Status: DC

## 2017-10-30 MED ORDER — ENOXAPARIN (LOVENOX) PATIENT EDUCATION KIT
PACK | Freq: Once | Status: AC
  Administered 2017-10-30: 13:00:00
  Filled 2017-10-30: qty 1

## 2017-10-30 MED ORDER — ENSURE ENLIVE PO LIQD: 237.0000 mL | Freq: Three times a day (TID) | ORAL | 12 refills | Status: DC

## 2017-10-30 MED ORDER — POLYETHYLENE GLYCOL 3350 17 G PO PACK: 17.0000 g | PACK | Freq: Every day | ORAL | 0 refills | Status: DC

## 2017-10-30 MED ORDER — ACETAMINOPHEN 160 MG/5ML PO SOLN: 1000.0000 mg | Freq: Four times a day (QID) | ORAL | 0 refills | Status: DC | PRN
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
## 2017-10-30 NOTE — Progress Notes (Addendum)
Call received from bedside RN-Luisa regarding transition needs- pt has w/c at the bedside- needs RW- bedside RN to get MD order for RW- father is still awaiting delivery of 3n1 and hospital bed to home. [facility] f[facility] be [facility] to be sure they reach out to father for delivery time today for DME. Notified Reggie with [facility] for delivery of RW they will plan on delivery of RW along with 3n1 and bed to the home this afternoon. Bedside RN aware of the delivery of DME to the home.  Donn PieriniKristi Azzure Garabedian, RN CM [phone]
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
## 2017-10-30 NOTE — Discharge Instructions (Signed)
Orthopaedic Trauma Service Discharge Instructions   General Discharge Instructions  WEIGHT BEARING STATUS: Weightbearing as tolerated on R leg for transfers only. Nonweightbearing left leg  RANGE OF MOTION/ACTIVITY: unrestricted range of motion Both hips, knees and ankles   Wound Care: ok to clean wound on left flank with soap and water. Ok to leave uncovered  Discharge Wound Care Instructions  Do NOT apply any ointments, solutions or lotions to pin sites or surgical wounds.  These prevent needed drainage and even though solutions like hydrogen peroxide kill bacteria, they also damage cells lining the pin sites that help fight infection.  Applying lotions or ointments can keep the wounds moist and can cause them to breakdown and open up as well. This can increase the risk for infection. When in doubt call the office.  Surgical incisions should be dressed daily.  If any drainage is noted, use one layer of adaptic, then gauze, Kerlix, and an ace wrap.  Once the incision is completely dry and without drainage, it may be left open to air out.  Showering may begin 36-48 hours later.  Cleaning gently with soap and water.  Traumatic wounds should be dressed daily as well.    One layer of adaptic, gauze, Kerlix, then ace wrap.  The adaptic can be discontinued once the draining has ceased    If you have a wet to dry dressing: wet the gauze with saline the squeeze as much saline out so the gauze is moist (not soaking wet), place moistened gauze over wound, then place a dry gauze over the moist one, followed by Kerlix wrap, then ace wrap.    DVT/PE prophylaxis: Lovenox 40 mg subcutaneous injection daily x 28 days   Diet: as you were eating previously.  Can use over the counter stool softeners and bowel preparations, such as Miralax, to help with bowel movements.  Narcotics can be constipating.  Be sure to drink plenty of fluids  PAIN MEDICATION USE AND EXPECTATIONS  You have likely been given  narcotic medications to help control your pain.  After a traumatic event that results in an fracture (broken bone) with or without surgery, it is ok to use narcotic pain medications to help control one's pain.  We understand that everyone responds to pain differently and each individual patient will be evaluated on a regular basis for the continued need for narcotic medications. Ideally, narcotic medication use should last no more than 6-8 weeks (coinciding with fracture healing).   As a patient it is your responsibility as well to monitor narcotic medication use and report the amount and frequency you use these medications when you come to your office visit.   We would also advise that if you are using narcotic medications, you should take a dose prior to therapy to maximize you participation.  IF YOU ARE ON NARCOTIC MEDICATIONS IT IS NOT PERMISSIBLE TO OPERATE A MOTOR VEHICLE (MOTORCYCLE/CAR/TRUCK/MOPED) OR HEAVY MACHINERY DO NOT MIX NARCOTICS WITH OTHER CNS (CENTRAL NERVOUS SYSTEM) DEPRESSANTS SUCH AS ALCOHOL   STOP SMOKING OR USING NICOTINE PRODUCTS!!!!  As discussed nicotine severely impairs your body's ability to heal surgical and traumatic wounds but also impairs bone healing.  Wounds and bone heal by forming microscopic blood vessels (angiogenesis) and nicotine is a vasoconstrictor (essentially, shrinks blood vessels).  Therefore, if vasoconstriction occurs to these microscopic blood vessels they essentially disappear and are unable to deliver necessary nutrients to the healing tissue.  This is one modifiable factor that you can do to dramatically increase  your chances of healing your injury.    (This means no smoking, no nicotine gum, patches, etc)  DO NOT USE NONSTEROIDAL ANTI-INFLAMMATORY DRUGS (NSAID'S)  Using products such as Advil (ibuprofen), Aleve (naproxen), Motrin (ibuprofen) for additional pain control during fracture healing can delay and/or prevent the healing response.  If you  would like to take over the counter (OTC) medication, Tylenol (acetaminophen) is ok.  However, some narcotic medications that are given for pain control contain acetaminophen as well. Therefore, you should not exceed more than 4000 mg of tylenol in a day if you do not have liver disease.  Also note that there are may OTC medicines, such as cold medicines and allergy medicines that my contain tylenol as well.  If you have any questions about medications and/or interactions please ask your doctor/PA or your pharmacist.      ICE AND ELEVATE INJURED/OPERATIVE EXTREMITY  Using ice and elevating the injured extremity above your heart can help with swelling and pain control.  Icing in a pulsatile fashion, such as 20 minutes on and 20 minutes off, can be followed.    Do not place ice directly on skin. Make sure there is a barrier between to skin and the ice pack.    Using frozen items such as frozen peas works well as the conform nicely to the are that needs to be iced.  USE AN ACE WRAP OR TED HOSE FOR SWELLING CONTROL  In addition to icing and elevation, Ace wraps or TED hose are used to help limit and resolve swelling.  It is recommended to use Ace wraps or TED hose until you are informed to stop.    When using Ace Wraps start the wrapping distally (farthest away from the body) and wrap proximally (closer to the body)   Example: If you had surgery on your leg or thing and you do not have a splint on, start the ace wrap at the toes and work your way up to the thigh        If you had surgery on your upper extremity and do not have a splint on, start the ace wrap at your fingers and work your way up to the upper arm  IF YOU ARE IN A SPLINT OR CAST DO NOT REMOVE IT FOR ANY REASON   If your splint gets wet for any reason please contact the office immediately. You may shower in your splint or cast as long as you keep it dry.  This can be done by wrapping in a cast cover or garbage back (or similar)  Do Not stick  any thing down your splint or cast such as pencils, money, or hangers to try and scratch yourself with.  If you feel itchy take benadryl as prescribed on the bottle for itching  IF YOU ARE IN A CAM BOOT (BLACK BOOT)  You may remove boot periodically. Perform daily dressing changes as noted below.  Wash the liner of the boot regularly and wear a sock when wearing the boot. It is recommended that you sleep in the boot until told otherwise  CALL THE OFFICE WITH ANY QUESTIONS OR CONCERNS: [phone]
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
## 2017-10-30 NOTE — Plan of Care (Signed)
  Problem: Pain Managment: Goal: General experience of comfort will improve Outcome: Progressing   
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
## 2018-09-21 ENCOUNTER — Emergency Department: Payer: BLUE CROSS/BLUE SHIELD

## 2018-09-21 ENCOUNTER — Emergency Department
Admission: EM | Admit: 2018-09-21 | Discharge: 2018-09-21 | Disposition: A | Discharge status: Home / Self Care | Payer: BLUE CROSS/BLUE SHIELD | Attending: Emergency Medicine | Admitting: Emergency Medicine

## 2018-09-21 ENCOUNTER — Other Ambulatory Visit: Payer: Self-pay

## 2018-09-21 DIAGNOSIS — Y9389 Activity, other specified: Secondary | ICD-10-CM | POA: Insufficient documentation

## 2018-09-21 DIAGNOSIS — S76112A Strain of left quadriceps muscle, fascia and tendon, initial encounter: Secondary | ICD-10-CM | POA: Diagnosis not present

## 2018-09-21 DIAGNOSIS — Y929 Unspecified place or not applicable: Secondary | ICD-10-CM | POA: Insufficient documentation

## 2018-09-21 DIAGNOSIS — Z79899 Other long term (current) drug therapy: Secondary | ICD-10-CM | POA: Insufficient documentation

## 2018-09-21 DIAGNOSIS — W010XXA Fall on same level from slipping, tripping and stumbling without subsequent striking against object, initial encounter: Secondary | ICD-10-CM | POA: Insufficient documentation

## 2018-09-21 DIAGNOSIS — S8992XA Unspecified injury of left lower leg, initial encounter: Secondary | ICD-10-CM | POA: Diagnosis present

## 2018-09-21 DIAGNOSIS — Y999 Unspecified external cause status: Secondary | ICD-10-CM | POA: Diagnosis not present

## 2018-09-21 DIAGNOSIS — I10 Essential (primary) hypertension: Secondary | ICD-10-CM | POA: Insufficient documentation

## 2018-09-21 MED ORDER — OXYCODONE-ACETAMINOPHEN 5-325 MG PO TABS: 1.0000 | ORAL_TABLET | ORAL | 0 refills | Status: DC | PRN

## 2018-09-21 MED ORDER — HYDROCODONE-ACETAMINOPHEN 5-325 MG PO TABS: 1.0000 | ORAL_TABLET | ORAL | 0 refills | Status: DC | PRN

## 2018-09-21 MED ORDER — ONDANSETRON HCL 4 MG PO TABS: 4.0000 mg | ORAL_TABLET | Freq: Every day | ORAL | 0 refills | Status: DC | PRN

## 2018-09-21 MED ORDER — HYDROCODONE-ACETAMINOPHEN 5-325 MG PO TABS
1.0000 | ORAL_TABLET | Freq: Once | ORAL | Status: AC
  Administered 2018-09-21: 1 via ORAL
  Filled 2018-09-21: qty 1

## 2018-09-21 MED ORDER — IBUPROFEN 400 MG PO TABS
400.0000 mg | ORAL_TABLET | Freq: Once | ORAL | Status: AC | PRN
  Administered 2018-09-21: 400 mg via ORAL
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
## 2018-09-21 NOTE — ED Notes (Signed)
Pt yells across lobby "am I close?" pt updated by registration.

## 2018-09-21 NOTE — ED Notes (Signed)
Patient transported to MRI 

## 2018-09-21 NOTE — ED Notes (Signed)

## 2018-09-21 NOTE — ED Notes (Signed)
Patient transported to X-ray 

## 2018-09-21 NOTE — ED Notes (Signed)
Pt talking in lobby on phone in no acute distress.
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
## 2018-09-21 NOTE — ED Provider Notes (Signed)
MRI results discussed with patient as well as Dr. Mack Guise of orthopedics.  He recommends close outpatient follow-up for probable surgical repair once the swelling resolves.  Patient placed in knee immobilizer.  His pain is controlled.  Discussed signs and symptoms for which she should return to the ER.  Have discussed with the patient and available family all diagnostics and treatments performed thus far and all questions were answered to the best of my ability. The patient demonstrates understanding and agreement with plan.    Merlyn Lot, MD 09/21/18 [phone]

## 2018-09-24 ENCOUNTER — Other Ambulatory Visit
Admission: RE | Admit: 2018-09-24 | Discharge: 2018-09-24 | Disposition: A | Discharge status: Home / Self Care | Payer: BLUE CROSS/BLUE SHIELD | Source: Ambulatory Visit | Attending: Orthopedic Surgery | Admitting: Orthopedic Surgery

## 2018-09-24 ENCOUNTER — Ambulatory Visit: Payer: BLUE CROSS/BLUE SHIELD | Admitting: Certified Registered Nurse Anesthetist

## 2018-09-24 ENCOUNTER — Other Ambulatory Visit: Payer: Self-pay | Admitting: Orthopedic Surgery

## 2018-09-24 ENCOUNTER — Encounter: Payer: Self-pay | Admitting: Anesthesiology

## 2018-09-24 ENCOUNTER — Other Ambulatory Visit: Payer: Self-pay

## 2018-09-24 ENCOUNTER — Observation Stay
Admission: RE | Admit: 2018-09-24 | Discharge: 2018-09-25 | Disposition: A | Discharge status: Home / Self Care | Payer: BLUE CROSS/BLUE SHIELD | Attending: Orthopedic Surgery | Admitting: Orthopedic Surgery

## 2018-09-24 ENCOUNTER — Encounter
Admission: RE | Disposition: A | Discharge status: Home / Self Care | Payer: Self-pay | Source: Home / Self Care | Attending: Orthopedic Surgery

## 2018-09-24 DIAGNOSIS — Z885 Allergy status to narcotic agent status: Secondary | ICD-10-CM | POA: Insufficient documentation

## 2018-09-24 DIAGNOSIS — I10 Essential (primary) hypertension: Secondary | ICD-10-CM | POA: Insufficient documentation

## 2018-09-24 DIAGNOSIS — X58XXXA Exposure to other specified factors, initial encounter: Secondary | ICD-10-CM | POA: Diagnosis not present

## 2018-09-24 DIAGNOSIS — Z79899 Other long term (current) drug therapy: Secondary | ICD-10-CM | POA: Insufficient documentation

## 2018-09-24 DIAGNOSIS — S76112A Strain of left quadriceps muscle, fascia and tendon, initial encounter: Secondary | ICD-10-CM | POA: Diagnosis not present

## 2018-09-24 DIAGNOSIS — Z1159 Encounter for screening for other viral diseases: Secondary | ICD-10-CM | POA: Diagnosis not present

## 2018-09-24 HISTORY — PX: REPAIR QUADRICEPS/HAMSTRING MUSCLES: SHX6563

## 2018-09-24 LAB — CBC WITH DIFFERENTIAL/PLATELET
Abs Immature Granulocytes: 0.02 10*3/uL (ref 0.00–0.07)
Basophils Absolute: 0 10*3/uL (ref 0.0–0.1)
Basophils Relative: 1 %
Eosinophils Absolute: 0.3 10*3/uL (ref 0.0–0.5)
Eosinophils Relative: 5 %
HCT: 41.1 % (ref 39.0–52.0)
Hemoglobin: 13.9 g/dL (ref 13.0–17.0)
Immature Granulocytes: 0 %
Lymphocytes Relative: 34 %
Lymphs Abs: 1.9 10*3/uL (ref 0.7–4.0)
MCH: 29.8 pg (ref 26.0–34.0)
MCHC: 33.8 g/dL (ref 30.0–36.0)
MCV: 88.2 fL (ref 80.0–100.0)
Monocytes Absolute: 0.4 10*3/uL (ref 0.1–1.0)
Monocytes Relative: 8 %
Neutro Abs: 2.8 10*3/uL (ref 1.7–7.7)
Neutrophils Relative %: 52 %
Platelets: 248 10*3/uL (ref 150–400)
RBC: 4.66 MIL/uL (ref 4.22–5.81)
RDW: 12.3 % (ref 11.5–15.5)
WBC: 5.4 10*3/uL (ref 4.0–10.5)
nRBC: 0 % (ref 0.0–0.2)

## 2018-09-24 LAB — PROTIME-INR
INR: 0.9 (ref 0.8–1.2)
Prothrombin Time: 12 seconds (ref 11.4–15.2)

## 2018-09-24 LAB — BASIC METABOLIC PANEL
Anion gap: 8 (ref 5–15)
BUN: 16 mg/dL (ref 6–20)
CO2: 26 mmol/L (ref 22–32)
Calcium: 9.1 mg/dL (ref 8.9–10.3)
Chloride: 103 mmol/L (ref 98–111)
Creatinine, Ser: 1.32 mg/dL — ABNORMAL HIGH (ref 0.61–1.24)
GFR calc Af Amer: >60 mL/min (ref >60)
GFR calc non Af Amer: >60 mL/min (ref >60)
Glucose, Bld: 114 mg/dL — ABNORMAL HIGH (ref 70–99)
Potassium: 3.8 mmol/L (ref 3.5–5.1)
Sodium: 137 mmol/L (ref 135–145)

## 2018-09-24 LAB — APTT: aPTT: 38 seconds — ABNORMAL HIGH (ref 24–36)

## 2018-09-24 LAB — SARS CORONAVIRUS 2 BY RT PCR (HOSPITAL ORDER, PERFORMED IN ~~LOC~~ HOSPITAL LAB): SARS Coronavirus 2: NEGATIVE

## 2018-09-24 SURGERY — REPAIR, MUSCLE, QUADRICEPS OR HAMSTRING
Anesthesia: General | Site: Leg Upper | Laterality: Left

## 2018-09-24 MED ORDER — LABETALOL HCL 5 MG/ML IV SOLN
5.0000 mg | INTRAVENOUS | Status: AC | PRN
  Administered 2018-09-24 (×2): 5 mg via INTRAVENOUS

## 2018-09-24 MED ORDER — ONDANSETRON HCL 4 MG/2ML IJ SOLN
INTRAMUSCULAR | Status: AC
  Administered 2018-09-24: 4 mg via INTRAVENOUS
  Filled 2018-09-24: qty 2

## 2018-09-24 MED ORDER — PROMETHAZINE HCL 25 MG/ML IJ SOLN
INTRAMUSCULAR | Status: AC
  Administered 2018-09-24: 6.25 mg via INTRAVENOUS
  Filled 2018-09-24: qty 1

## 2018-09-24 MED ORDER — LABETALOL HCL 5 MG/ML IV SOLN
INTRAVENOUS | Status: AC
  Administered 2018-09-24: 5 mg via INTRAVENOUS
  Filled 2018-09-24: qty 4

## 2018-09-24 MED ORDER — LIDOCAINE HCL (CARDIAC) PF 100 MG/5ML IV SOSY
PREFILLED_SYRINGE | INTRAVENOUS | Status: DC | PRN
  Administered 2018-09-24: 50 mg via INTRAVENOUS

## 2018-09-24 MED ORDER — ONDANSETRON HCL 4 MG/2ML IJ SOLN
INTRAMUSCULAR | Status: AC
  Filled 2018-09-24: qty 2

## 2018-09-24 MED ORDER — ROCURONIUM BROMIDE 50 MG/5ML IV SOLN
INTRAVENOUS | Status: AC
  Filled 2018-09-24: qty 1

## 2018-09-24 MED ORDER — FENTANYL CITRATE (PF) 100 MCG/2ML IJ SOLN
INTRAMUSCULAR | Status: AC
  Administered 2018-09-24: 25 ug via INTRAVENOUS
  Filled 2018-09-24: qty 2

## 2018-09-24 MED ORDER — GLYCOPYRROLATE 0.2 MG/ML IJ SOLN
INTRAMUSCULAR | Status: DC | PRN
  Administered 2018-09-24: 0.2 mg via INTRAVENOUS

## 2018-09-24 MED ORDER — LACTATED RINGERS IV SOLN
INTRAVENOUS | Status: DC
  Administered 2018-09-24 (×2): via INTRAVENOUS

## 2018-09-24 MED ORDER — BUPIVACAINE HCL 0.25 % IJ SOLN
INTRAMUSCULAR | Status: DC | PRN
  Administered 2018-09-24: 30 mL

## 2018-09-24 MED ORDER — CEFAZOLIN SODIUM-DEXTROSE 2-4 GM/100ML-% IV SOLN
2.0000 g | INTRAVENOUS | Status: AC
  Administered 2018-09-24: 17:00:00 2 g via INTRAVENOUS

## 2018-09-24 MED ORDER — MIDAZOLAM HCL 2 MG/2ML IJ SOLN
INTRAMUSCULAR | Status: DC | PRN
  Administered 2018-09-24: 2 mg via INTRAVENOUS

## 2018-09-24 MED ORDER — LABETALOL HCL 5 MG/ML IV SOLN
5.0000 mg | INTRAVENOUS | Status: AC | PRN
  Administered 2018-09-24 (×3): 5 mg via INTRAVENOUS

## 2018-09-24 MED ORDER — DEXAMETHASONE SODIUM PHOSPHATE 10 MG/ML IJ SOLN
INTRAMUSCULAR | Status: AC
  Filled 2018-09-24: qty 1

## 2018-09-24 MED ORDER — DEXAMETHASONE SODIUM PHOSPHATE 10 MG/ML IJ SOLN
INTRAMUSCULAR | Status: DC | PRN
  Administered 2018-09-24: 10 mg via INTRAVENOUS

## 2018-09-24 MED ORDER — ONDANSETRON HCL 4 MG/2ML IJ SOLN
INTRAMUSCULAR | Status: DC | PRN
  Administered 2018-09-24: 4 mg via INTRAVENOUS

## 2018-09-24 MED ORDER — HYDRALAZINE HCL 20 MG/ML IJ SOLN
10.0000 mg | Freq: Once | INTRAMUSCULAR | Status: AC
  Administered 2018-09-24: 10 mg via INTRAVENOUS

## 2018-09-24 MED ORDER — ONDANSETRON HCL 4 MG/2ML IJ SOLN
4.0000 mg | Freq: Once | INTRAMUSCULAR | Status: AC
  Administered 2018-09-24: 4 mg via INTRAVENOUS

## 2018-09-24 MED ORDER — HYDROMORPHONE HCL 1 MG/ML IJ SOLN
0.5000 mg | INTRAMUSCULAR | Status: AC | PRN
  Administered 2018-09-24 (×4): 0.5 mg via INTRAVENOUS

## 2018-09-24 MED ORDER — LIDOCAINE HCL (PF) 2 % IJ SOLN
INTRAMUSCULAR | Status: AC
  Filled 2018-09-24: qty 10

## 2018-09-24 MED ORDER — LABETALOL HCL 5 MG/ML IV SOLN
5.0000 mg | INTRAVENOUS | Status: DC
  Administered 2018-09-24: 5 mg via INTRAVENOUS

## 2018-09-24 MED ORDER — SODIUM CHLORIDE FLUSH 0.9 % IV SOLN
INTRAVENOUS | Status: AC
  Filled 2018-09-24: qty 10

## 2018-09-24 MED ORDER — BUPIVACAINE HCL (PF) 0.25 % IJ SOLN
INTRAMUSCULAR | Status: AC
  Filled 2018-09-24: qty 30

## 2018-09-24 MED ORDER — CHLORHEXIDINE GLUCONATE CLOTH 2 % EX PADS: 6.0000 | MEDICATED_PAD | Freq: Once | CUTANEOUS | Status: DC

## 2018-09-24 MED ORDER — PROMETHAZINE HCL 25 MG/ML IJ SOLN
6.2500 mg | INTRAMUSCULAR | Status: AC | PRN
  Administered 2018-09-24 (×2): 6.25 mg via INTRAVENOUS

## 2018-09-24 MED ORDER — FENTANYL CITRATE (PF) 100 MCG/2ML IJ SOLN
INTRAMUSCULAR | Status: AC
  Filled 2018-09-24: qty 2

## 2018-09-24 MED ORDER — FENTANYL CITRATE (PF) 100 MCG/2ML IJ SOLN
25.0000 ug | INTRAMUSCULAR | Status: AC | PRN
  Administered 2018-09-24 (×6): 25 ug via INTRAVENOUS

## 2018-09-24 MED ORDER — FENTANYL CITRATE (PF) 100 MCG/2ML IJ SOLN
INTRAMUSCULAR | Status: DC | PRN
  Administered 2018-09-24 (×6): 50 ug via INTRAVENOUS

## 2018-09-24 MED ORDER — CEFAZOLIN SODIUM 1 G IJ SOLR
INTRAMUSCULAR | Status: AC
  Filled 2018-09-24: qty 20

## 2018-09-24 MED ORDER — MIDAZOLAM HCL 2 MG/2ML IJ SOLN
INTRAMUSCULAR | Status: AC
  Filled 2018-09-24: qty 2

## 2018-09-24 MED ORDER — HYDROMORPHONE HCL 1 MG/ML IJ SOLN
INTRAMUSCULAR | Status: AC
  Administered 2018-09-24: 0.5 mg via INTRAVENOUS
  Filled 2018-09-24: qty 1

## 2018-09-24 MED ORDER — HYDROCODONE-ACETAMINOPHEN 5-325 MG PO TABS: 1.0000 | ORAL_TABLET | ORAL | 0 refills | Status: AC | PRN

## 2018-09-24 MED ORDER — DEXMEDETOMIDINE HCL IN NACL 200 MCG/50ML IV SOLN
INTRAVENOUS | Status: AC
  Filled 2018-09-24: qty 50

## 2018-09-24 MED ORDER — PROPOFOL 10 MG/ML IV BOLUS
INTRAVENOUS | Status: DC | PRN
  Administered 2018-09-24: 60 mg via INTRAVENOUS
  Administered 2018-09-24: 200 mg via INTRAVENOUS

## 2018-09-24 MED ORDER — HYDRALAZINE HCL 20 MG/ML IJ SOLN
INTRAMUSCULAR | Status: AC
  Administered 2018-09-24: 10 mg via INTRAVENOUS
  Filled 2018-09-24: qty 1

## 2018-09-24 MED ORDER — DEXMEDETOMIDINE HCL IN NACL 200 MCG/50ML IV SOLN
INTRAVENOUS | Status: DC | PRN
  Administered 2018-09-24 (×3): 20 ug via INTRAVENOUS

## 2018-09-24 MED ORDER — PROPOFOL 10 MG/ML IV BOLUS
INTRAVENOUS | Status: AC
  Filled 2018-09-24: qty 20

## 2018-09-24 MED ORDER — NEOMYCIN-POLYMYXIN B GU 40-200000 IR SOLN
Status: DC | PRN
  Administered 2018-09-24: 2 mL

## 2018-09-24 SURGICAL SUPPLY — 51 items
BANDAGE ELASTIC 6 CLIP NS LF (GAUZE/BANDAGES/DRESSINGS) ×2 IMPLANT
BLADE SURG SZ10 CARB STEEL (BLADE) ×6 IMPLANT
BNDG COHESIVE 4X5 TAN STRL (GAUZE/BANDAGES/DRESSINGS) ×2 IMPLANT
BNDG ESMARK 4X12 TAN STRL LF (GAUZE/BANDAGES/DRESSINGS) ×2 IMPLANT
BRACE KNEE POST OP SHORT (BRACE) ×3 IMPLANT
BUR 4X55 1 (BURR) ×2 IMPLANT
BUR SURG 4X8 MED (BURR) ×1 IMPLANT
BURR SURG 4MMX8MM MEDIUM (BURR) ×1
BURR SURG 4X8 MED (BURR) ×2
CANISTER SUCT 1200ML W/VALVE (MISCELLANEOUS) ×3 IMPLANT
COVER WAND RF STERILE (DRAPES) ×3 IMPLANT
CUFF TOURN SGL QUICK 24 (TOURNIQUET CUFF)
CUFF TOURN SGL QUICK 30 (TOURNIQUET CUFF) ×3
CUFF TRNQT CYL 24X4X16.5-23 (TOURNIQUET CUFF) IMPLANT
CUFF TRNQT CYL 30X4X21-28X (TOURNIQUET CUFF) IMPLANT
DRAPE U-SHAPE 47X51 STRL (DRAPES) ×3 IMPLANT
DRSG OPSITE POSTOP 4X8 (GAUZE/BANDAGES/DRESSINGS) ×2 IMPLANT
DURAPREP 26ML APPLICATOR (WOUND CARE) ×9 IMPLANT
ELECT REM PT RETURN 9FT ADLT (ELECTROSURGICAL) ×3
ELECTRODE REM PT RTRN 9FT ADLT (ELECTROSURGICAL) ×1 IMPLANT
GAUZE SPONGE 4X4 12PLY STRL (GAUZE/BANDAGES/DRESSINGS) ×3 IMPLANT
GAUZE XEROFORM 1X8 LF (GAUZE/BANDAGES/DRESSINGS) ×3 IMPLANT
GLOVE BIOGEL PI IND STRL 9 (GLOVE) ×1 IMPLANT
GLOVE BIOGEL PI INDICATOR 9 (GLOVE) ×2
GLOVE SURG 9.0 ORTHO LTXF (GLOVE) ×6 IMPLANT
GOWN STRL REUS TWL 2XL XL LVL4 (GOWN DISPOSABLE) ×3 IMPLANT
GOWN STRL REUS W/ TWL LRG LVL3 (GOWN DISPOSABLE) ×1 IMPLANT
GOWN STRL REUS W/TWL LRG LVL3 (GOWN DISPOSABLE) ×3
KIT TURNOVER KIT A (KITS) ×3 IMPLANT
NDL MAYO 6 CRC TAPER PT (NEEDLE) IMPLANT
NEEDLE MAYO 6 CRC TAPER PT (NEEDLE) ×3 IMPLANT
PACK EXTREMITY ARMC (MISCELLANEOUS) ×3 IMPLANT
PAD ABD DERMACEA PRESS 5X9 (GAUZE/BANDAGES/DRESSINGS) ×2 IMPLANT
PAD CAST CTTN 4X4 STRL (SOFTGOODS) IMPLANT
PADDING CAST COTTON 4X4 STRL (SOFTGOODS) ×6
RETRIEVER SUT HEWSON (MISCELLANEOUS) ×5 IMPLANT
STAPLER SKIN PROX 35W (STAPLE) ×3 IMPLANT
STOCKINETTE M/LG 89821 (MISCELLANEOUS) ×3 IMPLANT
SUT ETHIBOND 5-0 MS/4 CCS GRN (SUTURE) ×3
SUT FIBERWIRE #5 38 CONV BLUE (SUTURE) ×6
SUT ORTHOCORD OS-6 NDL 36 (SUTURE) ×3 IMPLANT
SUT ORTHOCORD W/MULTIPK NDL (SUTURE) ×3 IMPLANT
SUT TICRON 2-0 30IN 311381 (SUTURE) ×9 IMPLANT
SUT VIC AB 0 CT1 36 (SUTURE) ×6 IMPLANT
SUT VIC AB 2-0 CT1 (SUTURE) ×6 IMPLANT
SUT VICRYL 1-0 27IN AB (SUTURE) ×1
SUT VICRYL 1-0 27IN ABS (SUTURE) ×3
SUTURE ETHBND 5-0 MS/4 CCS GRN (SUTURE) ×1 IMPLANT
SUTURE FIBERWR #5 38 CONV BLUE (SUTURE) IMPLANT
SUTURE VICRYL 1-0 27IN AB (SUTURE) ×1 IMPLANT
SYR 20CC LL (SYRINGE) ×3 IMPLANT

## 2018-09-24 NOTE — H&P (Signed)
PREOPERATIVE H&P  Chief Complaint: Left quadriceps tendon rupture  HPI: Richard Valentine is a 43 y.o. male who presents for preoperative history and physical with a diagnosis of left quadriceps tendon rupture sustained on 09/21/2018.  Patient is unable to actively extend his knee.  MRI performed in the emergency room confirmed a left quadriceps tendon rupture.  Surgical repair was recommended.  Patient agrees with the plan for surgery.  Past Medical History:  Diagnosis Date  . Hypertension    Past Surgical History:  Procedure Laterality Date  . achilles tenden rupture repair    . FRACTURE OF PELVIS  10/27/2017    LEFT AND RIGHT SACRO-ILIAC SCREW FIXATION (Left Pelvis)  . MANDIBLE FRACTURE SURGERY     Bilateral mandible fractures   . ORIF MANDIBULAR FRACTURE Bilateral 10/25/2017   Procedure: CLOSED REDUCTION OF RIGHT SUBCONDYLAR FRACTURE WITH MMF,CLOSED REDUCTIONLEFT PARASYMPHASIS FRACTURE WITH MMF,SURGICAL EXTRACTION OF TEETH #26 AND #30, PLACEMENT OF MAXILLARY/ MANDIBULAR HYBRID ARCH BARS;  Surgeon: Michael Litter, DMD;  Location: [facility];  Service: Oral Surgery;  Laterality: Bilateral;  . ORIF PELVIC FRACTURE WITH PERCUTANEOUS SCREWS Left 10/25/2017   Procedure: LEFT AND RIGHT SACRO-ILIAC SCREW FIXATION;  Surgeon: Altamese Fair Plain, MD;  Location: [facility];  Service: Orthopedics;  Laterality: Left;  . patella tenden rupture repair     Social History   Socioeconomic History  . Marital status: Married    Spouse name: Not on file  . Number of children: Not on file  . Years of education: Not on file  . Highest education level: Not on file  Occupational History  . Occupation: Arboriculturist  . Financial resource strain: Not on file  . Food insecurity    Worry: Not on file    Inability: Not on file  . Transportation needs    Medical: Not on file    Non-medical: Not on file  Tobacco Use  . Smoking status: Never Smoker  . Smokeless tobacco: Never Used  Substance and Sexual  Activity  . Alcohol use: Yes    Alcohol/week: 22.0 standard drinks    Types: 20 Shots of liquor, 2 Standard drinks or equivalent per week    Comment: pt states drinking amount varies from week to week; mostly drinks on the weekends   . Drug use: No  . Sexual activity: Yes    Partners: Female  Lifestyle  . Physical activity    Days per week: Not on file    Minutes per session: Not on file  . Stress: Not on file  Relationships  . Social Herbalist on phone: Not on file    Gets together: Not on file    Attends religious service: Not on file    Active member of club or organization: Not on file    Attends meetings of clubs or organizations: Not on file    Relationship status: Not on file  Other Topics Concern  . Not on file  Social History Narrative  . Not on file   Family History  Problem Relation Age of Onset  . Diabetes Mother   . Hypertension Father    Allergies  Allergen Reactions  . Percocet [Oxycodone-Acetaminophen] Nausea Only and Other (See Comments)    Feels bad   Prior to Admission medications   Medication Sig Start Date End Date Taking? Authorizing Provider  hydrochlorothiazide (HYDRODIURIL) 25 MG tablet Take 1 tablet (25 mg total) by mouth daily. 10/30/17  Yes Jill Alexanders, PA-C  lisinopril (  PRINIVIL,ZESTRIL) 10 MG tablet Take 1 tablet (10 mg total) by mouth daily. 10/30/17  Yes Simaan, Francine GravenElizabeth S, PA-C  ondansetron (ZOFRAN) 4 MG tablet Take 1 tablet (4 mg total) by mouth daily as needed. Patient not taking: Reported on 09/23/2018 09/21/18 09/21/19  Willy Eddyobinson, Patrick, MD     Positive ROS: All other systems have been reviewed and were otherwise negative with the exception of those mentioned in the HPI and as above.  Physical Exam: General: Alert, no acute distress Cardiovascular: Regular rate and rhythm, no murmurs rubs or gallops.  No pedal edema Respiratory: Clear to auscultation bilaterally, no wheezes rales or rhonchi. No cyanosis, no use of  accessory musculature GI: No organomegaly, abdomen is soft and non-tender nondistended with positive bowel sounds. Skin: Skin intact, no lesions within the operative field. Neurologic: Sensation intact distally Psychiatric: Patient is competent for consent with normal mood and affect Lymphatic: No cervical lymphadenopathy  MUSCULOSKELETAL: Left knee: Patient skin is intact.  Patient does not have significant swelling.  There is no erythema or ecchymosis seen.  Patient has a palpable defect of the biceps tendon.  There is no palpable defect of the patella.  There is no defect of the patella tendon.  Patient's thigh and leg compartments are soft and compressible.  He has palpable pedal pulses, intact sensation light touch and intact motor function distally.  Assessment: Left quadriceps tendon rupture  Plan: Plan for Procedure(s): OPEN REPAIR OF LEFT QUADRICEPS TENDON  I reviewed the details of the operation as well as the postoperative course.  I discussed the risks and benefits of surgery. The risks include but are not limited to infection, bleeding, nerve or blood vessel injury, joint stiffness or loss of motion, persistent pain, weakness or instability, Re-rupture of the quadriceps, failure of repair, wound dehiscence and  and the need for further surgery. Medical risks include but are not limited to DVT and pulmonary embolism, myocardial infarction, stroke, pneumonia, respiratory failure and death. Patient understood these risks and wished to proceed.     Juanell FairlyKevin Clide Remmers, MD   09/24/2018 11:42 AM

## 2018-09-24 NOTE — Anesthesia Preprocedure Evaluation (Signed)
Anesthesia Evaluation  Patient identified by MRN, date of birth, ID band Patient awake    Reviewed: Allergy & Precautions, H&P , NPO status , Patient's Chart, lab work & pertinent test results, reviewed documented beta blocker date and time   History of Anesthesia Complications Negative for: history of anesthetic complications  Airway Mallampati: I  TM Distance: >3 FB Neck ROM: full    Dental  (+) Partial Lower, Dental Advidsory Given   Pulmonary neg pulmonary ROS,           Cardiovascular Exercise Tolerance: Good hypertension, (-) angina(-) Past MI (-) dysrhythmias (-) Valvular Problems/Murmurs     Neuro/Psych negative neurological ROS  negative psych ROS   GI/Hepatic negative GI ROS, Neg liver ROS,   Endo/Other  negative endocrine ROS  Renal/GU negative Renal ROS  negative genitourinary   Musculoskeletal   Abdominal   Peds  Hematology negative hematology ROS (+)   Anesthesia Other Findings Past Medical History: No date: Hypertension   Reproductive/Obstetrics negative OB ROS                             Anesthesia Physical Anesthesia Plan  ASA: II  Anesthesia Plan: General   Post-op Pain Management:    Induction: Intravenous  PONV Risk Score and Plan: 2 and Ondansetron, Dexamethasone, Midazolam, Promethazine and Treatment may vary due to age or medical condition  Airway Management Planned: LMA and Oral ETT  Additional Equipment:   Intra-op Plan:   Post-operative Plan: Extubation in OR  Informed Consent: I have reviewed the patients History and Physical, chart, labs and discussed the procedure including the risks, benefits and alternatives for the proposed anesthesia with the patient or authorized representative who has indicated his/her understanding and acceptance.     Dental Advisory Given  Plan Discussed with: Anesthesiologist, CRNA and Surgeon  Anesthesia Plan  Comments:         Anesthesia Quick Evaluation

## 2018-09-24 NOTE — Anesthesia Procedure Notes (Signed)
Procedure Name: LMA Insertion Date/Time: 09/24/2018 5:00 PM Performed by: Rolla Plate, CRNA Pre-anesthesia Checklist: Patient identified, Patient being monitored, Timeout performed, Emergency Drugs available and Suction available Patient Re-evaluated:Patient Re-evaluated prior to induction Oxygen Delivery Method: Circle system utilized Preoxygenation: Pre-oxygenation with 100% oxygen Induction Type: IV induction Ventilation: Mask ventilation without difficulty LMA: LMA inserted LMA Size: 4.0 Tube type: Oral Number of attempts: 1 Placement Confirmation: positive ETCO2 and breath sounds checked- equal and bilateral Tube secured with: Tape Dental Injury: Teeth and Oropharynx as per pre-operative assessment

## 2018-09-24 NOTE — Transfer of Care (Signed)
Immediate Anesthesia Transfer of Care Note  Patient: Richard Valentine  Procedure(s) Performed: REPAIR QUADRICEPS/HAMSTRING MUSCLES (Left Leg Upper)  Patient Location: PACU  Anesthesia Type:General  Level of Consciousness: sedated  Airway & Oxygen Therapy: Patient Spontanous Breathing and Patient connected to face mask oxygen  Post-op Assessment: Report given to RN and Post -op Vital signs reviewed and stable  Post vital signs: Reviewed  Last Vitals:  Vitals Value Taken Time  BP 120/71 09/24/18 1936  Temp 36.3 C 09/24/18 1936  Pulse 101 09/24/18 1936  Resp 15 09/24/18 1936  SpO2 96 % 09/24/18 1936  Vitals shown include unvalidated device data.  Last Pain:  Vitals:   09/24/18 1050  TempSrc: Oral  PainSc: 0-No pain         Complications: No apparent anesthesia complications

## 2018-09-24 NOTE — Anesthesia Post-op Follow-up Note (Signed)
Anesthesia QCDR form completed.        

## 2018-09-24 NOTE — Op Note (Signed)
09/24/2018  8:28 PM  PATIENT:  Richard Valentine    PRE-OPERATIVE DIAGNOSIS:  Left quadriceps tendon rupture  POST-OPERATIVE DIAGNOSIS:  Same  PROCEDURE:  OPEN REPAIR OF LEFT QUADRICEPS TENDON RUPTURE  SURGEON:  Thornton Park, MD   Tourniquet time: 119 minutes  ANESTHESIA:   General and local with 0.25% marcaine plain   PREOPERATIVE INDICATIONS:  Richard Valentine is a  43 y.o. male with a diagnosis of an acute left quadriceps tendon rupture confirmed by MRI.  Given the tendon displacement, surgical repair was recommended.   Patient agreed with the plan for surgery.  He has previously had a right patella tendon repair.  I discussed the risks and benefits of surgery. The risks include but are not limited to infection, bleeding, nerve or blood vessel injury, joint stiffness or loss of motion, persistent pain, weakness or instability, rerupture of the quadriceps tendon, failure of the repair and the need for further surgery. Medical risks include but are not limited to DVT and pulmonary embolism, myocardial infarction, stroke, pneumonia, respiratory failure and death. Patient understood these risks and wished to proceed.   OPERATIVE FINDINGS: Full-thickness quadriceps tendon rupture of the left knee with extension into the medial and lateral retinaculum at the junction of the vastus medialis and vastus lateralis.  The quadriceps tendon was intrasubstance with a small stump of tendon left attached to the superior pole of the patella.   OPERATIVE PROCEDURE: Patient was met in the preoperative area.  A preop history and physical was performed at the bedside.  I marked the left upper extremity with the word yes and my initials according the hospital's correct site of surgery protocol.  Patient was brought to the operating room.  He was placed supine on the operative table.  The patient underwent general anesthesia with an LMA.  All bony prominences were adequately padded.  A bump was placed  under his left hip.  A tourniquet was applied to the left upper thigh.  She was prepped and draped in a sterile fashion.  A timeout was performed to verify the patient's name, date of birth, medical record number, correct site of surgery and correct procedure to be performed.  Is also used to verify the patient received antibiotics all appropriate instruments, implants and radiographic studies were available in the room.  The patient received 2 g of Ancef prior to the onset of the case.  Patient's left lower extremity was exsanguinated with an Esmarch.  The tourniquet was inflated to 256mHg for a total of 119 minutes.  Longitudinal incision was made over the left knee extending from in the inferior pole the patella to several centimeters above the superior pole of the patella.  Full-thickness skin flaps were created.  The tendon tear was then identified.  The tendon was torn in an oblique fashion from distal medial to proximal lateral.  Approximately 2 cm of tendon stump still attached to the proximal pole of the patella.  The tendon in this area appeared thin and it was felt the tear involve the laminar component.  The tear extended screen the patella and vastus medialis and laterally between the patella and vastus lateralis.  The knee joint was copiously irrigated.  There was no focal chondral lesions seen in the patellofemoral joint.  The quadriceps tendon was then sutured with #5 FiberWire x 2 a Krakw fashion.  The superior pole of the patella was debrided with a round bur deep to the tendon stump.  The longitudinal drill holes were  made through the patella.  Shuttling sutures were passed through these 3 tunnels.  These 4 suture limbs of the #5 FiberWire were then passed through the tendon stump approximately 1 cm proximal to the superior pole of the patella.  Each pair of sutures was tied to approximate the tendon stump with the proximal aspect of the quadriceps tendon.  The sutures were then passed back  through the tendon at the superior pole of the patella.  The sutures were then shuttled through the 3 vertical drill holes.  The 2 central sutures passed through the central hole in the medial and lateral sutures were passed through the respective holes.  The lateral sutures were then tied together to the inferior pole of the patella and the medial sutures were also tied together in a similar fashion.  The remaining superior most 1 cm portion of the tendon stump was then laid over the repair and sutured to the quadriceps tendon with #2 Tycron in an interrupted fashion.  The vastus medialis and vastus lateralis were then repaired to the superior medial and lateral patella respectively with #2 Tycron.  The patient could flex his knee to approximately 80 to 90 degrees without gapping of the quadriceps tendon repair.    The wound was copiously irrigated.  The peritenon over the patella tendon was repaired with 0 Vicryl.  Superior to the patella the subcutaneous tissues were closed with 0 and 2-0 Vicryl.  Inferiorly the subcutaneous tissue was closed only with 2-0 Vicryl and the skin was approximated with staples.  A dry sterile dressing was applied after quarter percent Marcaine plain was injected anteriorly and intra-articularly.  The tourniquet was deflated at 119 minutes. The patient was placed in a hinged knee brace locked in extension.  All sharp, sponge and instrument counts were correct at the conclusion the case.  I scrubbed and present for the entire case.  I spoke with the patient significant other by phone from the PACU given COVID restrictions on visitors.  Let her know the case was performed without complication the patient was stable in the recovery room.

## 2018-09-25 ENCOUNTER — Other Ambulatory Visit: Payer: Self-pay

## 2018-09-25 ENCOUNTER — Encounter: Payer: Self-pay | Admitting: Orthopedic Surgery

## 2018-09-25 DIAGNOSIS — S76112A Strain of left quadriceps muscle, fascia and tendon, initial encounter: Secondary | ICD-10-CM | POA: Diagnosis not present

## 2018-09-25 LAB — CBC
HCT: 37.4 % — ABNORMAL LOW (ref 39.0–52.0)
Hemoglobin: 12.7 g/dL — ABNORMAL LOW (ref 13.0–17.0)
MCH: 29.5 pg (ref 26.0–34.0)
MCHC: 34 g/dL (ref 30.0–36.0)
MCV: 87 fL (ref 80.0–100.0)
Platelets: 240 10*3/uL (ref 150–400)
RBC: 4.3 MIL/uL (ref 4.22–5.81)
RDW: 12.1 % (ref 11.5–15.5)
WBC: 10.8 10*3/uL — ABNORMAL HIGH (ref 4.0–10.5)
nRBC: 0 % (ref 0.0–0.2)

## 2018-09-25 MED ORDER — HYDROCHLOROTHIAZIDE 25 MG PO TABS
25.0000 mg | ORAL_TABLET | Freq: Every day | ORAL | Status: DC
  Administered 2018-09-25: 25 mg via ORAL
  Filled 2018-09-25: qty 1

## 2018-09-25 MED ORDER — PHENOL 1.4 % MT LIQD
1.0000 | OROMUCOSAL | Status: DC | PRN
  Filled 2018-09-25: qty 177

## 2018-09-25 MED ORDER — ONDANSETRON HCL 4 MG/2ML IJ SOLN: 4.0000 mg | Freq: Four times a day (QID) | INTRAMUSCULAR | Status: DC | PRN

## 2018-09-25 MED ORDER — POTASSIUM CHLORIDE IN NACL 20-0.9 MEQ/L-% IV SOLN
INTRAVENOUS | Status: DC
  Administered 2018-09-25: 02:00:00 via INTRAVENOUS
  Filled 2018-09-25 (×3): qty 1000

## 2018-09-25 MED ORDER — POLYETHYLENE GLYCOL 3350 17 G PO PACK
17.0000 g | PACK | Freq: Every day | ORAL | Status: DC | PRN
  Administered 2018-09-25: 17 g via ORAL
  Filled 2018-09-25: qty 1

## 2018-09-25 MED ORDER — METOCLOPRAMIDE HCL 10 MG PO TABS: 5.0000 mg | ORAL_TABLET | Freq: Three times a day (TID) | ORAL | Status: DC | PRN

## 2018-09-25 MED ORDER — ALUM & MAG HYDROXIDE-SIMETH 200-200-20 MG/5ML PO SUSP: 30.0000 mL | ORAL | Status: DC | PRN

## 2018-09-25 MED ORDER — ENOXAPARIN SODIUM 40 MG/0.4ML ~~LOC~~ SOLN: 40.0000 mg | SUBCUTANEOUS | Status: DC

## 2018-09-25 MED ORDER — CEFAZOLIN SODIUM-DEXTROSE 1-4 GM/50ML-% IV SOLN
1.0000 g | Freq: Four times a day (QID) | INTRAVENOUS | Status: AC
  Administered 2018-09-25 (×2): 1 g via INTRAVENOUS
  Filled 2018-09-25 (×2): qty 50

## 2018-09-25 MED ORDER — MENTHOL 3 MG MT LOZG
1.0000 | LOZENGE | OROMUCOSAL | Status: DC | PRN
  Filled 2018-09-25: qty 9

## 2018-09-25 MED ORDER — MAGNESIUM CITRATE PO SOLN
1.0000 | Freq: Once | ORAL | Status: DC | PRN
  Filled 2018-09-25: qty 296

## 2018-09-25 MED ORDER — ONDANSETRON HCL 4 MG PO TABS: 4.0000 mg | ORAL_TABLET | Freq: Four times a day (QID) | ORAL | Status: DC | PRN

## 2018-09-25 MED ORDER — BISACODYL 10 MG RE SUPP: 10.0000 mg | Freq: Every day | RECTAL | Status: DC | PRN

## 2018-09-25 MED ORDER — METHOCARBAMOL 1000 MG/10ML IJ SOLN
500.0000 mg | Freq: Four times a day (QID) | INTRAVENOUS | Status: DC | PRN
  Filled 2018-09-25: qty 5

## 2018-09-25 MED ORDER — ACETAMINOPHEN 325 MG PO TABS: 325.0000 mg | ORAL_TABLET | Freq: Four times a day (QID) | ORAL | Status: DC | PRN

## 2018-09-25 MED ORDER — HYDROCODONE-ACETAMINOPHEN 7.5-325 MG PO TABS: 1.0000 | ORAL_TABLET | ORAL | Status: DC | PRN

## 2018-09-25 MED ORDER — LISINOPRIL 10 MG PO TABS
10.0000 mg | ORAL_TABLET | Freq: Every day | ORAL | Status: DC
  Administered 2018-09-25: 10 mg via ORAL
  Filled 2018-09-25: qty 1

## 2018-09-25 MED ORDER — METHOCARBAMOL 500 MG PO TABS
500.0000 mg | ORAL_TABLET | Freq: Four times a day (QID) | ORAL | Status: DC | PRN
  Administered 2018-09-25 (×2): 500 mg via ORAL
  Filled 2018-09-25 (×2): qty 1

## 2018-09-25 MED ORDER — HYDROCODONE-ACETAMINOPHEN 5-325 MG PO TABS
1.0000 | ORAL_TABLET | ORAL | Status: DC | PRN
  Administered 2018-09-25 (×3): 2 via ORAL
  Filled 2018-09-25 (×3): qty 2

## 2018-09-25 MED ORDER — DOCUSATE SODIUM 100 MG PO CAPS
100.0000 mg | ORAL_CAPSULE | Freq: Two times a day (BID) | ORAL | Status: DC
  Administered 2018-09-25: 100 mg via ORAL
  Filled 2018-09-25: qty 1

## 2018-09-25 MED ORDER — METOCLOPRAMIDE HCL 5 MG/ML IJ SOLN: 5.0000 mg | Freq: Three times a day (TID) | INTRAMUSCULAR | Status: DC | PRN

## 2018-09-25 MED ORDER — KETOROLAC TROMETHAMINE 15 MG/ML IJ SOLN
7.5000 mg | Freq: Four times a day (QID) | INTRAMUSCULAR | Status: DC
  Administered 2018-09-25 (×3): 7.5 mg via INTRAVENOUS
  Filled 2018-09-25 (×3): qty 1

## 2018-09-25 MED ORDER — MORPHINE SULFATE (PF) 4 MG/ML IV SOLN: 1.0000 mg | INTRAVENOUS | Status: DC | PRN
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
## 2018-09-25 NOTE — Progress Notes (Signed)
  Subjective:  POD #1 s/p left quadriceps tendon repair.   Patient reports left knee pain as mild to moderate.  Patient was admitted overnight for pain control.  He is feeling well today and feels he is ready to go home.  Patient is seen at the bedside today with his nurse, Janett Billow.  Objective:   VITALS:   Vitals:   09/25/18 0336 09/25/18 0447 09/25/18 0751 09/25/18 1007  BP: (!) 137/98 (!) 149/92 136/76 134/84  Pulse: 98 92 81 83  Resp: 18 18    Temp: 98.9 F (37.2 C) 97.7 F (36.5 C) (!) 97.5 F (36.4 C)   TempSrc: Oral Oral Oral   SpO2: 100% 100% 100%   Weight:      Height:        PHYSICAL EXAM: Left lower extremity: Neurovascular intact Sensation intact distally Intact pulses distally Dorsiflexion/Plantar flexion intact Incision: dressing C/D/I No cellulitis present Compartment soft  LABS  Results for orders placed or performed during the hospital encounter of 09/24/18 (from the past 24 hour(s))  CBC     Status: Abnormal   Collection Time: 09/25/18  6:28 AM  Result Value Ref Range   WBC 10.8 (H) 4.0 - 10.5 K/uL   RBC 4.30 4.22 - 5.81 MIL/uL   Hemoglobin 12.7 (L) 13.0 - 17.0 g/dL   HCT 37.4 (L) 39.0 - 52.0 %   MCV 87.0 80.0 - 100.0 fL   MCH 29.5 26.0 - 34.0 pg   MCHC 34.0 30.0 - 36.0 g/dL   RDW 12.1 11.5 - 15.5 %   Platelets 240 150 - 400 K/uL   nRBC 0.0 0.0 - 0.2 %    No results found.  Assessment/Plan: 1 Day Post-Op   Active Problems:   Rupture of left quadriceps tendon  Patient be discharged home today.  He will continue to elevate the lower extremity at home.  He will use crutches to get around and will be toe-touch weightbearing on the left lower extremity.  Patient's prescription has already been sent into his pharmacy.  I instructed her to take enteric-coated aspirin 325 mg twice a day for DVT prophylaxis while his brace is locked in extension.  Patient is to wear his knee brace at all times.  He knows to avoid any knee flexion at all which may  place tension on his repair.  Patient will cover his bandage and knee brace with a plastic bag for showering.  He will leave the bandage and brace in place until his follow-up with me in the office in approximately 10 days.    Thornton Park , MD 09/25/2018, 1:21 PM

## 2018-09-25 NOTE — Progress Notes (Signed)
OT Cancellation Note  Patient Details Name: Richard Valentine MRN: [id number] DOB: 07-Dec-1975   OT Screen:    Reason Eval/Treat Not Completed: OT screened, no needs identified, will sign off. Thank you for the OT consult. Order received and chart reviewed. Per physical, therapist who saw this pt this am, pt has undergone similar sx in past and feels confident in ADL mgt. Pt is moving safely using his crutches and would not benefit from further skilled OT at this time. Will sign off. Please re-consult if additional OT needs arise during this admission.   Shara Blazing, M.S., OTR/L Ascom: [phone] 09/25/18, 10:52 AM

## 2018-09-25 NOTE — Progress Notes (Signed)
Discharge instructions given to pt. No questions at this time. Pt dressed and will discharge home with friend.

## 2018-10-01 ENCOUNTER — Encounter: Payer: Self-pay | Admitting: Orthopedic Surgery

## 2018-10-05 NOTE — Anesthesia Postprocedure Evaluation (Signed)
Anesthesia Post Note  Patient: Richard Valentine  Procedure(s) Performed: REPAIR QUADRICEPS/HAMSTRING MUSCLES (Left Leg Upper)  Patient location during evaluation: PACU Anesthesia Type: General Level of consciousness: awake and alert Pain management: pain level controlled Vital Signs Assessment: post-procedure vital signs reviewed and stable Respiratory status: spontaneous breathing, nonlabored ventilation, respiratory function stable and patient connected to nasal cannula oxygen Cardiovascular status: blood pressure returned to baseline and stable Postop Assessment: no apparent nausea or vomiting Anesthetic complications: no     Last Vitals:  Vitals:   09/25/18 0751 09/25/18 1007  BP: 136/76 134/84  Pulse: 81 83  Resp:    Temp: (!) 36.4 C   SpO2: 100%     Last Pain:  Vitals:   09/25/18 1108  TempSrc:   PainSc: 2                  Molli Barrows
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
